# Patient Record
Sex: Male | Born: 1965 | Race: Black or African American | Hispanic: No | Marital: Married | Smoking: Never smoker
Health system: Southern US, Community
[De-identification: ages and names within clinical notes are randomized; demographics above are authoritative.]

## PROBLEM LIST (undated history)

## (undated) DIAGNOSIS — J45909 Unspecified asthma, uncomplicated: Secondary | ICD-10-CM

## (undated) DIAGNOSIS — K219 Gastro-esophageal reflux disease without esophagitis: Secondary | ICD-10-CM

## (undated) DIAGNOSIS — E291 Testicular hypofunction: Secondary | ICD-10-CM

## (undated) DIAGNOSIS — S62101A Fracture of unspecified carpal bone, right wrist, initial encounter for closed fracture: Secondary | ICD-10-CM

## (undated) DIAGNOSIS — I10 Essential (primary) hypertension: Secondary | ICD-10-CM

## (undated) HISTORY — DX: Testicular hypofunction: E29.1

## (undated) HISTORY — DX: Gastro-esophageal reflux disease without esophagitis: K21.9

## (undated) HISTORY — DX: Unspecified asthma, uncomplicated: J45.909

---

## 2000-03-07 ENCOUNTER — Other Ambulatory Visit: Payer: Self-pay | Admitting: Family Medicine

## 2002-05-17 ENCOUNTER — Ambulatory Visit

## 2002-05-18 ENCOUNTER — Encounter: Admitting: Family Medicine

## 2002-05-24 ENCOUNTER — Other Ambulatory Visit: Payer: Self-pay | Admitting: Family Medicine

## 2002-05-26 ENCOUNTER — Encounter

## 2002-06-23 ENCOUNTER — Encounter: Admitting: Family Medicine

## 2002-06-23 ENCOUNTER — Ambulatory Visit

## 2002-06-25 ENCOUNTER — Other Ambulatory Visit: Payer: Self-pay | Admitting: Family Medicine

## 2003-03-22 ENCOUNTER — Ambulatory Visit

## 2003-03-23 ENCOUNTER — Ambulatory Visit: Admitting: Family Medicine

## 2003-03-23 DIAGNOSIS — E291 Testicular hypofunction: Secondary | ICD-10-CM | POA: Insufficient documentation

## 2003-03-23 DIAGNOSIS — K219 Gastro-esophageal reflux disease without esophagitis: Secondary | ICD-10-CM | POA: Insufficient documentation

## 2003-03-23 HISTORY — DX: Gastro-esophageal reflux disease without esophagitis: K21.9

## 2003-03-23 HISTORY — DX: Testicular hypofunction: E29.1

## 2003-03-26 ENCOUNTER — Encounter: Payer: Self-pay | Admitting: Family Medicine

## 2003-03-26 DIAGNOSIS — J45909 Unspecified asthma, uncomplicated: Secondary | ICD-10-CM | POA: Insufficient documentation

## 2003-03-26 HISTORY — DX: Unspecified asthma, uncomplicated: J45.909

## 2003-04-22 ENCOUNTER — Other Ambulatory Visit: Payer: Self-pay | Admitting: Family Medicine

## 2003-04-22 ENCOUNTER — Ambulatory Visit

## 2003-04-29 ENCOUNTER — Emergency Department (HOSPITAL_COMMUNITY): Admission: AD | Admit: 2003-04-29 | Discharge: 2003-04-29 | Payer: Self-pay | Admitting: Family Medicine

## 2003-08-23 ENCOUNTER — Ambulatory Visit

## 2003-10-31 ENCOUNTER — Other Ambulatory Visit: Payer: Self-pay | Admitting: Family Medicine

## 2003-10-31 MED ORDER — RHINOCORT 32 MCG/ACTUATION NASAL SPRAY
INHALATION_SPRAY | NASAL | Status: DC
Start: 2003-10-31 — End: 2003-10-31

## 2003-10-31 MED ORDER — RHINOCORT 32 MCG/ACTUATION NASAL SPRAY
INHALATION_SPRAY | NASAL | Status: DC
Start: 2003-10-31 — End: 2004-09-17

## 2003-10-31 NOTE — Telephone Encounter (Signed)
>>   Alex Werner Mon Oct 31, 2003 3:07 PM  Encounter initiated.

## 2003-10-31 NOTE — Telephone Encounter (Signed)
>>   Alex Werner [facility] Mon Oct 31, 2003 10:57 AM  fax refill request

## 2003-11-07 ENCOUNTER — Other Ambulatory Visit: Payer: Self-pay | Admitting: Family Medicine

## 2003-11-07 MED ORDER — ALBUTEROL 90 MCG/ACTUATION AEROSOL INHALER
INHALATION_SPRAY | RESPIRATORY_TRACT | Status: DC
Start: 2003-11-07 — End: 2004-10-29

## 2003-11-07 MED ORDER — FLOVENT 110 MCG/ACTUATION AEROSOL INHALER
INHALATION_SPRAY | RESPIRATORY_TRACT | Status: DC
Start: 2003-11-07 — End: 2005-04-29

## 2003-11-07 NOTE — Telephone Encounter (Signed)
>>   Alex Werner [facility] Mon Nov 07, 2003 10:59 AM  fax refill request

## 2003-11-21 ENCOUNTER — Ambulatory Visit

## 2003-11-25 ENCOUNTER — Encounter: Admitting: Urology

## 2004-01-18 ENCOUNTER — Ambulatory Visit

## 2004-01-27 ENCOUNTER — Encounter: Admitting: Urology

## 2004-02-06 ENCOUNTER — Other Ambulatory Visit: Payer: Self-pay | Admitting: Family Medicine

## 2004-02-06 MED ORDER — ANDRODERM 5 MG/24 HR TRANSDERMAL 24 HOUR PATCH
MEDICATED_PATCH | TRANSDERMAL | Status: DC
Start: 2004-02-06 — End: 2004-09-10

## 2004-02-06 NOTE — Telephone Encounter (Signed)
>>   [facility] WH[facility] Mon Feb 06, 2004 11:12 AM  Encounter initiated.

## 2004-02-06 NOTE — Telephone Encounter (Signed)
>>   [facility] WHITE Mon Feb 06, 2004 11:55 AM  Encounter initiated.

## 2004-03-26 ENCOUNTER — Ambulatory Visit

## 2004-05-13 ENCOUNTER — Other Ambulatory Visit: Payer: Self-pay | Admitting: Family Medicine

## 2004-05-13 MED ORDER — RANITIDINE 150 MG TABLET
ORAL_TABLET | ORAL | Status: DC
Start: 2004-05-13 — End: 2005-07-24

## 2004-05-13 NOTE — Telephone Encounter (Signed)
>>   JENNIFER ALDRIDGE-GRANT Sun May 13, 2004 12:39 PM  Encounter initiated.

## 2004-07-20 ENCOUNTER — Other Ambulatory Visit: Payer: Self-pay | Admitting: Family Medicine

## 2004-07-20 ENCOUNTER — Encounter: Payer: Self-pay | Admitting: Family Medicine

## 2004-07-20 NOTE — Telephone Encounter (Signed)
>>   Alex Werner MOFFIT Mon Sep 17, 2004 1:57 PM  Encounter initiated.

## 2004-07-24 ENCOUNTER — Ambulatory Visit

## 2004-07-24 ENCOUNTER — Encounter: Payer: Self-pay | Admitting: Family Medicine

## 2004-07-25 ENCOUNTER — Encounter: Admitting: Family Medicine

## 2004-07-26 ENCOUNTER — Ambulatory Visit: Admitting: Family Medicine

## 2004-07-26 VITALS — BP 132/74 | HR 68 | Temp 98.4°F | Resp 12 | Ht 70.0 in | Wt 201.0 lb

## 2004-07-26 NOTE — Nursing Note (Signed)
>>   MCLANE, THERESE M     07/26/2004   9:12 am  Vital signs taken, allergies verified, screened for pain, med hx taken (if appropriate),  Bertell Maria, MA

## 2004-07-26 NOTE — Progress Notes (Signed)
Chief Complaint : Patient presents with:    Refill Request   Toe Problem    S: Alex Werner is a 47yr male .   needs prior auth of androderm doing well , no side effects , has not had lab recently , HAS RUN out of androderm 1 wk ago and can't get it refilled because of auth issues   ros: also has has one month history of toenail fungus on left foot 4th and 5th toenails , not painful     Social History Narrative   pmh - asthma , rectal bleeding, infertility ( couple ) ,rt shoulder pain , low testosterone ,gerd    surgery - none   meds- seravent, flovent 110, albuterol inhaler prn , androderm 5mg  daily,zantac 150mg  bid    allergies none    habits- non smoking , occ etoh , not exercising    sh- married 2 childern ,    fam hx - father with asthma , mom has irregular heart beats,        O:   BP 132/74   Pulse 68   Temp (Src) 98.4 (Tympanic)   Resp 12   Ht 5\' 10"  (1.9m)   Wt 201 lbs (91.2kg)  GENERAL APPEARANCE: appearing stated age in no acute distress   NECK: Neck supple, non-tender without lymphadenopathy, masses or thyromegaly.   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  education given as to diagnosis and treatment, no barriers to  learning ,understanding demonstrated  signed  Ferdinand Lango. MD

## 2004-09-10 ENCOUNTER — Other Ambulatory Visit: Payer: Self-pay | Admitting: Family Medicine

## 2004-09-10 MED ORDER — ANDRODERM 5 MG/24 HR TRANSDERMAL 24 HOUR PATCH
MEDICATED_PATCH | TRANSDERMAL | Status: DC
Start: 2004-09-10 — End: 2005-04-29

## 2004-09-10 NOTE — Telephone Encounter (Signed)
>>   Alex Werner Mon Sep 10, 2004 10:48 AM  Encounter initiated.

## 2004-09-17 ENCOUNTER — Other Ambulatory Visit: Payer: Self-pay | Admitting: Family Medicine

## 2004-09-17 MED ORDER — RHINOCORT 32 MCG/ACTUATION NASAL SPRAY
INHALATION_SPRAY | NASAL | Status: DC
Start: 2004-09-17 — End: 2005-11-22

## 2004-09-17 MED ORDER — RHINOCORT 32 MCG/ACTUATION NASAL SPRAY
INHALATION_SPRAY | NASAL | Status: DC
Start: 2004-09-17 — End: 2007-11-11

## 2004-09-17 NOTE — Telephone Encounter (Signed)
>>   Alex Werner MOFFIT Mon Sep 17, 2004 2:15 PM  Encounter initiated.

## 2004-10-24 ENCOUNTER — Encounter: Payer: Self-pay | Admitting: Family Medicine

## 2004-10-25 ENCOUNTER — Encounter: Payer: Self-pay | Admitting: Family Medicine

## 2004-10-29 ENCOUNTER — Other Ambulatory Visit: Payer: Self-pay | Admitting: Family Medicine

## 2004-10-29 MED ORDER — ALBUTEROL 90 MCG/ACTUATION AEROSOL INHALER
INHALATION_SPRAY | RESPIRATORY_TRACT | Status: DC
Start: 2004-10-29 — End: 2006-03-05

## 2004-10-29 NOTE — Telephone Encounter (Signed)
>>   Alex Werner Mon Oct 29, 2004 12:27 PM  Encounter initiated.

## 2005-04-29 ENCOUNTER — Other Ambulatory Visit: Payer: Self-pay | Admitting: Family Medicine

## 2005-04-29 MED ORDER — FLOVENT 110 MCG/ACTUATION AEROSOL INHALER
INHALATION_SPRAY | RESPIRATORY_TRACT | Status: DC
Start: 2005-04-29 — End: 2006-06-02

## 2005-04-29 MED ORDER — ANDRODERM 5 MG/24 HR TRANSDERMAL 24 HOUR PATCH
MEDICATED_PATCH | TRANSDERMAL | Status: DC
Start: 2005-04-29 — End: 2005-11-22

## 2005-07-24 ENCOUNTER — Other Ambulatory Visit: Payer: Self-pay | Admitting: Family Medicine

## 2005-07-24 MED ORDER — RANITIDINE 150 MG TABLET
ORAL_TABLET | ORAL | Status: DC
Start: 2005-07-24 — End: 2006-09-16

## 2005-08-22 ENCOUNTER — Ambulatory Visit

## 2005-08-23 ENCOUNTER — Encounter: Payer: Self-pay | Admitting: Family Medicine

## 2005-08-23 ENCOUNTER — Ambulatory Visit: Admitting: Family Medicine

## 2005-08-23 VITALS — BP 114/66 | HR 68 | Temp 97.6°F | Resp 16 | Ht 69.0 in | Wt 195.0 lb

## 2005-08-23 MED ORDER — DOXYCYCLINE MONOHYDRATE 100 MG CAPSULE
ORAL_CAPSULE | ORAL | Status: AC
Start: 2005-08-23 — End: 2006-08-23

## 2005-08-23 NOTE — Progress Notes (Signed)
Chief Complaint : Patient presents with:    Skin Problem - spot on fore head   Referral    S: Alex Werner is a 23yr male .   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 ,) coming back   he needs a referral back to urology for vasectomy , had consult but never followed through with procedure and told he needs a new referral   due for lab , is androgen replacement     Social History Narrative   pmh - asthma , rectal bleeding, infertility ( couple ) ,rt shoulder pain , low testosterone ,gerd    surgery - none   meds- flovent 110, albuterol inhaler prn , androderm 5mg  daily,zantac 150mg  bid    allergies none    habits- non smoking , occ etoh , not exercising    sh- married 2 childern ,    fam hx - father with asthma , mom has irregular heart beats,        O:   BP 114/66  Pulse 68  Temp (Src) 97.6 (Tympanic)  Resp 16  Ht 5\' 9"  (1.28m)  Wt 195 lbs (88.5kg)  GENERAL APPEARANCE: appearing stated age in no acute distress   erythematous papules cheeks and foreheads   left forearm there is a small soft tissue mass over area of previous surgery   A:  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 (primary encounter diagnosis)  Note:   Plan: DOXYCYCLINE 100 MG CAP   rtc 2 months     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  education given as to diagnosis and treatment, no barriers to learning ,understanding demonstrated  signed  Ferdinand Lango. MD

## 2005-08-23 NOTE — Nursing Note (Signed)
>>   Alex Werner     08/23/2005   10:30 am  Vital signs taken, allergies verified and screened for pain.  Alex Mins, MA

## 2005-08-29 ENCOUNTER — Encounter

## 2005-09-03 ENCOUNTER — Encounter: Payer: Self-pay | Admitting: Family Medicine

## 2005-09-24 ENCOUNTER — Ambulatory Visit

## 2005-09-24 VITALS — BP 120/84 | Temp 97.1°F | Ht 69.0 in | Wt 197.2 lb

## 2005-09-24 NOTE — Nursing Note (Signed)
>>   Lynelle Smoke [facility]     09/24/2005   8:48 am  Vital signs taken, allergies verified, screened for pain, med hx taken,    Mahmoda Aminy-Ahmadyar, LVN

## 2005-09-24 NOTE — Progress Notes (Signed)
Vasectomy Consultation Note      Date: 09/24/2005  Alex Werner is a 40yr old male who desires elective vasectomy as a permanent means of contraception. ros: ENT: He has history of allergic rhinitis, no epistaxis. No non-healing ulcerations. No hoarseness. No recurrent OM or Sore Throat. No recurrent sinusitis. No tinnitus. No vertigo.  General: No malaise, no weight loss, no appetite disturbance. No fevers or chills.  GI: history of GERD No current pain. No melena. No hematochezia. No constipation or diarrhea. No change in stool caliber. No jaundice. No H/O of hepatitis. No H/O of PUD. No dysphagia. No odynophagia.  Cardiac: No chest pain. No DOE. No orthopnea. No PND. No edema. No weight gain. No palpitations. No lightheadedness or syncope.  NEURO: No loss of vision, diplopia. No recurrent or severe headaches. No transient weakness or numbness. No visual change or speech change. No clumsiness no ataxia. No tremor. No problems with disorientation or confusion.  PSYCHE: No sadness, sleep disturbance, appetite disturbance, phobias. No euphoric episodes.   Resp: history of mild persistent asthma No presistent cough, no wheezing. No hemoptysis. No DOE or orthopneaRHEUM: No joint effusions or erythema. No jaw claudication. No diplopia. No temporal headaches. No h/o serositis or iritis. No alopecia or skin rash.   URO: No dysuria, urinary frequency. No hematuria. No flank pain. 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   EG normal testies Nl SSC no nodule nontender cord and vas normal no hernias   The anatomy, physiology and how a vasectomy works and it is performed was discussed with the patient. It was emphasized that this is an IRREVERSIBLE procedure and that even though there are procedures which can reconnect the ends of the vas deferens, and sperm banking, these methods are not always successful and that there are no guarantees that fertility would be restored. Complications of  bleeding, infection, scrotal hematoma, sperm granuloma and post vas pain syndrome were discussed along with their respective treatments. The chances of spontaneous reversal and unwanted fertility were discussed, being between 1 in 200 and one in 2000. The need to have protected intercourse until two negative semen samples brought to the office one week apart 8 to 12 weeks after the procedure was discussed. The average convalescent time was discussed, as well as the need to limit activity for 2 weeks post-op . The patient verbally acknowledged that he understood and agreed and the counseling form was signed which indicates he understood the irreversible nature of a vasectomy. He will follow up after 72 hours for scheduling of procedure. Uncertain increased risk prostate cancer discussed.  I did review patient's past medical and family/social history.  Barriers to Learning assessed: none. Patient verbalizes understanding of teaching and instructions.   Ivan Anchors Demecia Northway MD

## 2005-10-16 ENCOUNTER — Ambulatory Visit

## 2005-10-25 ENCOUNTER — Encounter

## 2005-11-22 ENCOUNTER — Other Ambulatory Visit: Payer: Self-pay | Admitting: Family Medicine

## 2005-11-22 MED ORDER — RHINOCORT 32 MCG/ACTUATION NASAL SPRAY
INHALATION_SPRAY | NASAL | Status: DC
Start: 2005-11-22 — End: 2007-01-25

## 2005-11-22 MED ORDER — ANDRODERM 5 MG/24 HR TRANSDERMAL 24 HOUR PATCH
MEDICATED_PATCH | TRANSDERMAL | Status: DC
Start: 2005-11-22 — End: 2006-06-02

## 2005-12-06 ENCOUNTER — Ambulatory Visit

## 2005-12-06 VITALS — BP 140/94 | Temp 97.6°F | Ht 69.0 in | Wt 201.4 lb

## 2005-12-06 HISTORY — PX: PR VASECTOMY UNI/BI SPX W/POSTOP SEMEN EXAMS: 55250

## 2005-12-06 MED ORDER — VICODIN 5 MG-500 MG TABLET
ORAL_TABLET | ORAL | Status: DC
Start: 2005-12-06 — End: 2007-11-11

## 2005-12-06 NOTE — Progress Notes (Signed)
Elective Vasectomy   Alex Werner is a 40yr old male  After counseling and informed consent, as evidenced by the patient's signature on consent form, the patient was placed in the supine position and the hair was shaved form the scrotum and Betadine applied to operative area. The R hemiscrotum was then examined and the vas deferens was manually isolated and brought near the surface of the scrotal skin well to the R of midline. Approximately 5cc of 1% plain Xylocaine without epinephrine was injected over this area. A one inch incision was then made over the vas deferens through the scrotal skin. The R vas deferens was then secured with a baby babcock clamp and the surrounding connective tissue and vessels bluntly and sharply dissected. A one inch section of the vas deferens was then ligated at opposite end with a suture ligature and then divided at each end. The ends  were then electrocauterizied from the lumen out. The proximal end was then allowed to retract and tissue was sewn in between both ends with the testicular end secured in the subcutaneous tissue. The wound was then closed. All this was accomplished with 4/0 vicryl. In a like manner the opposite side was done without complications.   The wounds were then dressed with neosporin and 4X4's and the patient was assisted with putting on a tight fitting pair of underware. The patient was instructed to keep this     dressing in place for 48 hours and keep the wounds clean and dry for 72 hours. He was told to do no strenuous activity for two weeks. It was also reiterated that the patient should have no unprotected intercourse until two negative semen samples are obtained 8-12 weeks from this date. The patient was given a prescription for Vicodin 1-2  every 4 to 6 hours for pain and warned that these will cause drowsiness and may be habit forming. (#30 No Refills). He was told to follow up in two weeks or sooner if  copious bleeding occurs or the patient note  any redness, swelling or purulence of the wounds.  Ivan Anchors Lashonne Shull MD  Associate Medical Director   [facility]  [address]  [place], [place] [address]  [phone]            Ivan Anchors. Geroldine Esquivias MD

## 2005-12-06 NOTE — Nursing Note (Signed)
>>   Lynelle Smoke [facility]     12/06/2005   9:36 am  Vital signs taken, allergies verified, screened for pain, med hx taken,    Mahmoda Aminy-Ahmadyar, LVN

## 2005-12-20 ENCOUNTER — Ambulatory Visit

## 2005-12-24 ENCOUNTER — Encounter

## 2005-12-26 ENCOUNTER — Ambulatory Visit

## 2005-12-26 VITALS — BP 136/82 | Temp 97.7°F | Ht 69.0 in | Wt 200.8 lb

## 2005-12-26 NOTE — Progress Notes (Signed)
Alex Werner is a 40yr old male  S: Follow up vasectomy 2 weeks ago. No pain or swelling    O: Wounds are clean and dry without erythema. No scrotal masses    A: Healing vasectomy    P: Patient given sample cup for semen check 4-6 weeks from now. Make appt and donate sample just prior to coming into office, do not let set around. Imperative to use alternate means of contraception in the interim until sample without sperm noted. Patient understands and agrees. Follow up pain or swelling or scrotal mass. Activity as tolerated.    Earmon Phoenix MD

## 2005-12-26 NOTE — Nursing Note (Signed)
>>   Lynelle Smoke [facility]     12/26/2005   10:14 am  Vital signs taken, allergies verified, screened for pain, med hx taken,    Mahmoda Aminy-Ahmadyar, LVN

## 2006-01-28 ENCOUNTER — Ambulatory Visit

## 2006-01-28 VITALS — BP 128/70 | Temp 97.3°F

## 2006-01-28 NOTE — Nursing Note (Signed)
>>   BORJAS, MONICA L     01/28/2006   8:33 am  Vital signs taken, allergies verified, screened for pain, med hx taken (if appropriate).  Harlen Labs, MA

## 2006-01-28 NOTE — Progress Notes (Signed)
On semen examination a very occasional nonmotile sperm noted. Continue alternate means of contraception and repeat sample 2 weeks. Patient verbalizes understanding of instructions

## 2006-02-25 ENCOUNTER — Encounter

## 2006-02-25 ENCOUNTER — Encounter: Payer: Self-pay | Admitting: Family Medicine

## 2006-03-05 ENCOUNTER — Other Ambulatory Visit: Payer: Self-pay | Admitting: Family Medicine

## 2006-03-05 MED ORDER — ALBUTEROL 90 MCG/ACTUATION AEROSOL INHALER
INHALATION_SPRAY | RESPIRATORY_TRACT | Status: DC
Start: 2006-03-05 — End: 2007-04-13

## 2006-04-09 ENCOUNTER — Ambulatory Visit

## 2006-05-01 ENCOUNTER — Ambulatory Visit

## 2006-05-01 VITALS — BP 120/72 | HR 60 | Temp 98.0°F | Ht 69.0 in | Wt 204.2 lb

## 2006-05-01 NOTE — Nursing Note (Signed)
>>   HOUY NGOY     Thu May 01, 2006  8:48 AM  Vitals signs taken, allergies verified,screened for pain, med hx taken. Adelene Idler, [place]

## 2006-05-01 NOTE — Progress Notes (Signed)
Careful examination of fresh semen sample revealed NO sperm. OK to rely on vasectomy alone. Follow up as needed

## 2006-06-02 ENCOUNTER — Other Ambulatory Visit: Payer: Self-pay | Admitting: Family Medicine

## 2006-06-02 MED ORDER — FLOVENT 110 MCG/ACTUATION AEROSOL INHALER
INHALATION_SPRAY | RESPIRATORY_TRACT | Status: DC
Start: 2006-06-02 — End: 2007-11-08

## 2006-06-02 MED ORDER — ANDRODERM 5 MG/24 HR TRANSDERMAL 24 HOUR PATCH
MEDICATED_PATCH | TRANSDERMAL | Status: DC
Start: 2006-06-02 — End: 2007-01-26

## 2006-09-16 ENCOUNTER — Other Ambulatory Visit: Payer: Self-pay | Admitting: Family Medicine

## 2006-09-16 MED ORDER — RANITIDINE 150 MG TABLET
ORAL_TABLET | ORAL | Status: DC
Start: 2006-09-16 — End: 2007-02-23

## 2006-11-05 ENCOUNTER — Other Ambulatory Visit: Payer: Self-pay | Admitting: Family Medicine

## 2006-11-05 MED ORDER — DOXYCYCLINE HYCLATE 100 MG CAPSULE
ORAL_CAPSULE | ORAL | Status: AC
Start: 2006-11-05 — End: 2006-11-15

## 2007-01-25 ENCOUNTER — Other Ambulatory Visit: Payer: Self-pay | Admitting: Family Medicine

## 2007-01-25 MED ORDER — RHINOCORT 32 MCG/ACTUATION NASAL SPRAY
INHALATION_SPRAY | NASAL | Status: DC
Start: 2007-01-25 — End: 2007-11-08

## 2007-01-26 ENCOUNTER — Other Ambulatory Visit: Payer: Self-pay | Admitting: FAMILY PRACTICE

## 2007-01-26 MED ORDER — ANDRODERM 5 MG/24 HR TRANSDERMAL 24 HOUR PATCH
MEDICATED_PATCH | TRANSDERMAL | Status: DC
Start: 2007-01-26 — End: 2007-01-29

## 2007-01-29 ENCOUNTER — Other Ambulatory Visit: Payer: Self-pay | Admitting: Family Medicine

## 2007-01-29 MED ORDER — ANDRODERM 5 MG/24 HR TRANSDERMAL 24 HOUR PATCH
MEDICATED_PATCH | TRANSDERMAL | Status: DC
Start: 2007-01-29 — End: 2007-05-11

## 2007-02-23 ENCOUNTER — Encounter

## 2007-02-23 ENCOUNTER — Encounter: Payer: Self-pay | Admitting: Family Medicine

## 2007-02-23 ENCOUNTER — Ambulatory Visit: Admitting: Family Medicine

## 2007-02-23 ENCOUNTER — Ambulatory Visit

## 2007-02-23 VITALS — BP 136/84 | HR 72 | Temp 96.7°F | Resp 16 | Ht 69.0 in | Wt 216.4 lb

## 2007-02-23 MED ORDER — ROBAXIN-750  750 MG TABLET
ORAL_TABLET | ORAL | Status: DC
Start: 2007-02-23 — End: 2007-06-05

## 2007-02-23 MED ORDER — PENICILLIN V POTASSIUM 500 MG TABLET
ORAL_TABLET | ORAL | Status: DC
Start: 2007-02-23 — End: 2007-11-11

## 2007-02-23 MED ORDER — IBUPROFEN 600 MG TABLET
ORAL_TABLET | ORAL | Status: DC
Start: 2007-02-23 — End: 2007-05-06

## 2007-02-23 NOTE — Nursing Note (Signed)
>>   [facility] MOUA     Mon Feb 23, 2007  9:22 AM  Vital signs taken, allergies verified and screened for pain.  Aneta Mins, MA

## 2007-02-23 NOTE — Progress Notes (Signed)
Chief Complaint :  Chief Complaint   Patient presents with    Back Pain    Other     THROAT SWOLLENING    Snoring         S:  Alex Werner is a 22yr male .   snoring , chronic ,  lifelong , but worse recently ,   has gained weight over the years,   no sleep apnea,    some increased day time drowsiness, ,    chronic  low back pain   , 3 wks , , lower  back ,n o pain down legs,  no injury to back , no repetitive injury ,   sore throat    for 1 day  , swollen  ulvula ,  no  swollen glands in neck ,no fever,   no cough     due for follow up  lab is on androgen therapy   History   Social History Narrative      pmh - asthma , rectal bleeding, infertility ( couple ) ,rt shoulder pain , low testosterone ,gerd   rosacea     surgery - vasectomy     meds-  flovent 110,  albuterol inhaler prn ,  androderm 5mg  daily    allergies none     habits- non smoking , occ etoh , not exercising     sh- married  2 childern ,     fam hx - father with asthma , mom has irregular heart beats,       O:   BP 136/84  Pulse 72  Temp (Src) 35.9 C (96.7 F) (Tympanic)  Resp 16  Ht 1.753 m (5\' 9" )  Wt 98.175 kg (216 lb 7 oz)  GENERAL APPEARANCE: appearing stated age in no acute distress   erythema throat redness, swollen uvula    tympanic membranes normal   NECK: Neck supple, non-tender without lymphadenopathy, masses or thyromegaly.   CHEST: clear to auscultation  , heart nsr no murmur   tender in low back , limitation of flexion to   30 degrees   straight leg raising negative ,Reflexes equal and symmetric  , strength and sensatiion intact      A:  786.09AF Snoring  (primary encounter diagnosis)  Comment:   Plan: SLEEP STUDY        discussed possible approaches to snoring including  mouth appliances and use of  nasal cpap , surgery ,      462AH Pharyngitis Acute  Comment:   Plan: CULTURE BETA STREP ONLY, PENICILLIN V POTASSIUM        500 MG TAB        gargle with warm saline   chloroseptic spray     724.2 Lumbago  Comment:   Plan:  IBUPROFEN 600 MG TAB, ROBAXIN-750 750 MG TAB        Discussed a home back care exercise program with flexion exercise routine and a back exercise handout was given . Proper lifting with avoidance of heavy lifting discussed.     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   education given as to diagnosis and treatment, no barriers to learning ,understanding demonstrated  signed  Ferdinand Lango. MD

## 2007-02-24 ENCOUNTER — Encounter: Payer: Self-pay | Admitting: Family Medicine

## 2007-02-25 ENCOUNTER — Encounter: Payer: Self-pay | Admitting: Family Medicine

## 2007-02-26 NOTE — Progress Notes (Addendum)
tests were all normal  except for   the testosterone    has a normal     total testosterone now and a borderline low  free testosterone ,     <br    />recommend you just continue your current dose <br />throat culture    was negative for strep

## 2007-03-31 ENCOUNTER — Encounter: Payer: Self-pay | Admitting: Family Medicine

## 2007-04-01 NOTE — Progress Notes (Addendum)
here is   a new   supplier   of anti anti snoring mouth device<br    />[email or web address]

## 2007-04-13 ENCOUNTER — Other Ambulatory Visit: Payer: Self-pay | Admitting: Family Medicine

## 2007-04-13 MED ORDER — ALBUTEROL 90 MCG/ACTUATION AEROSOL INHALER
INHALATION_SPRAY | RESPIRATORY_TRACT | Status: DC
Start: 2007-04-13 — End: 2009-03-08

## 2007-04-14 ENCOUNTER — Encounter: Payer: Self-pay | Admitting: Family Medicine

## 2007-04-24 ENCOUNTER — Encounter: Payer: Self-pay | Admitting: Family Medicine

## 2007-04-24 NOTE — Progress Notes (Addendum)
the first test confirmed that you had  signficant sleep apnea   with    decreased breathing and drop in oxygen .. the next step is a trial of    the treatment for this which involves  sleeping with a device that    pushes pressurized air through your nose , and finding out  what    pressure is the best pressure , if this  test confirms that  the    treatment  is useful then the machine could be ordered for you .Marland Kitchen.   if    you wish to schedule the test  just call the sleep lab , if you want to    discuss this further make an appt with me

## 2007-05-06 ENCOUNTER — Other Ambulatory Visit: Payer: Self-pay | Admitting: Family Medicine

## 2007-05-06 MED ORDER — IBUPROFEN 600 MG TABLET
ORAL_TABLET | ORAL | Status: AC
Start: 2007-05-06 — End: 2008-05-05

## 2007-05-11 ENCOUNTER — Other Ambulatory Visit: Payer: Self-pay | Admitting: Family Medicine

## 2007-05-11 MED ORDER — ANDRODERM 5 MG/24 HR TRANSDERMAL 24 HOUR PATCH
MEDICATED_PATCH | TRANSDERMAL | Status: DC
Start: 2007-05-11 — End: 2007-12-31

## 2007-06-05 ENCOUNTER — Other Ambulatory Visit: Payer: Self-pay | Admitting: Family Medicine

## 2007-06-05 MED ORDER — ROBAXIN-750  750 MG TABLET
ORAL_TABLET | ORAL | Status: DC
Start: 2007-06-05 — End: 2009-03-08

## 2007-11-08 ENCOUNTER — Other Ambulatory Visit: Payer: Self-pay | Admitting: Family Medicine

## 2007-11-08 MED ORDER — DOXYCYCLINE MONOHYDRATE 100 MG CAPSULE
1.0000 | ORAL_CAPSULE | Freq: Two times a day (BID) | ORAL | Status: DC
Start: 2007-11-08 — End: 2009-03-08

## 2007-11-08 MED ORDER — FLUTICASONE 110 MCG/ACTUATION AEROSOL ORAL INHALER
1.0000 | INHALATION_SPRAY | Freq: Two times a day (BID) | RESPIRATORY_TRACT | Status: DC
Start: 2007-11-08 — End: 2009-03-08

## 2007-11-08 MED ORDER — BUDESONIDE 32 MCG/ACTUATION NASAL SPRAY,AEROSOL
2.0000 | INHALATION_SPRAY | Freq: Two times a day (BID) | NASAL | Status: DC
Start: 2007-11-08 — End: 2007-11-11

## 2007-11-10 ENCOUNTER — Ambulatory Visit

## 2007-11-11 ENCOUNTER — Encounter: Payer: Self-pay | Admitting: Family Medicine

## 2007-11-11 ENCOUNTER — Encounter

## 2007-11-11 ENCOUNTER — Ambulatory Visit: Admitting: Family Medicine

## 2007-11-11 VITALS — BP 130/76 | HR 60 | Temp 97.4°F | Resp 12 | Ht 69.0 in | Wt 214.1 lb

## 2007-11-11 DIAGNOSIS — M545 Low back pain, unspecified: Secondary | ICD-10-CM | POA: Insufficient documentation

## 2007-11-11 MED ORDER — CITALOPRAM 20 MG TABLET
20.0000 mg | ORAL_TABLET | Freq: Every day | ORAL | Status: DC
Start: 2007-11-11 — End: 2008-12-08

## 2007-11-11 NOTE — Progress Notes (Signed)
Chief Complaint :  Chief Complaint   Patient presents with    Medication Follow Up    Anxiety     Worse then before:  no medications given for this problem.  Daily anxiety,  mostly work related.       S:  Alex Werner is a 84yr male .   under stress , stressful at work , irritable , some depression as well  , ,   not much etoh  , does exercise, ,   RUE:[id number] tingling in bottom of feet   and fingers,   eating a healthy diet and  taking multivitamins ,   chronic low back pain , no pain down legs, needing to take  motrin and robaxin regularly   is on androderm  due for shot   has rosacea controlled on doxcycyline   asthma and allergies under control at present     History   Social History Narrative     pmh - asthma , rectal bleeding, infertility ( couple ) ,rt shoulder pain , low testosterone ,gerd   rosacea , lumbago     surgery - vasectomy     meds-  Current outpatient prescriptions   Medication Sig Dispense Refill    Doxycycline (MONODOX) 100 mg PO capsule Take 1 Cap by mouth 2 times daily.   60  12    Fluticasone (FLUTICASONE) 110 mcg/Actuation INHA Inhaler Take 1 Puff by inhalation 2 times daily.   12  12    ROBAXIN-750 750 MG TAB take 1 tablet (750 mg) by oral route every 4-6 hours  60   12    ANDRODERM 5 MG/24 HR TRANSDERM 24 HR PATCH apply 1 patch (5mg ) by transdermal route once daily  30  6    IBUPROFEN 600 MG TAB take 1 tablet by mouth 3 times daily as needed for  back pain  90  12    ALBUTEROL 90 MCG/ACTUATION AEROSOL INHALER inhale 1 puff by inhalation route every 4-6 hours as needed  1  12    SEREVENT DISKUS 50 MCG/DOSE INHL DSDV inhale the contents of one capsule by inhalation route 2 times per day morning and evening    0          allergies none     habits- non smoking , occ etoh , not exercising     sh- married  2 childern ,     fam hx - father with asthma , mom has irregular heart beats,       O:   BP 130/76  Pulse 60  Temp (Src) 36.3 C (97.4 F) (Tympanic)  Resp 12  Ht 1.753  m (5\' 9" )  Wt 97.115 kg (214 lb 1.6 oz)  GENERAL APPEARANCE: appearing stated age in no acute distress   affect normal   CHEST: clear to auscultation  , heart nsr no murmur   Reflexes equal and symmetric  , strength and sensatiion intact      A:  724.2 Lumbago  (primary encounter diagnosis)  Comment:   Plan: L-SPINE 2 OR 3 VIEWS        Discussed a home back care exercise program with flexion exercise routine and a back exercise handout was given . Proper lifting with avoidance of heavy lifting discussed.     356.9CE Peripheral Neuropathy  Comment:   Plan: URINALYSIS-COMPLETE, TSH (SENSITIVE),         THYROXINE, FREE (FREE T4), LIPID PANEL WITH  DLDL REFLEX, COMPREHENSIVE METABOLIC PANEL, CBC        AUTO + REFLEX MANUAL DIFF, VITAMIN B12,         HEMOGLOBIN A1C, FERRITIN, IRON TOTAL,         ANTI-NUCLEAR AB (ANA), FOLATE, TRANSFERRIN,         TESTOSTERONE PANEL, SED RATE WESTERGREN,         SYPHILIS TEST SERUM (RPR)            300.00G Anxiety Disorder  Comment:   Plan: CITALOPRAM 20 MG TAB        discussed the use of antianxiety medications , the various medications available and potential side effects and the reason for the current selection of medication , supportative counselling given ,        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   education given as to diagnosis and treatment, no barriers to learning ,understanding demonstrated  signed  Ferdinand Lango. MD

## 2007-11-11 NOTE — Nursing Note (Signed)
>>   Alex Werner     Wed Nov 11, 2007  8:54 AM  vitals taken, allergies verified, screened for pain.Dierdre Forth  Thank you

## 2007-11-15 ENCOUNTER — Encounter: Payer: Self-pay | Admitting: Family Medicine

## 2007-12-31 ENCOUNTER — Other Ambulatory Visit: Payer: Self-pay | Admitting: Family Medicine

## 2007-12-31 MED ORDER — TESTOSTERONE 5 MG/24 HR TRANSDERMAL 24 HOUR PATCH
1.0000 | MEDICATED_PATCH | Freq: Every day | TRANSDERMAL | Status: DC
Start: 2007-12-31 — End: 2008-09-05

## 2008-05-16 ENCOUNTER — Other Ambulatory Visit: Payer: Self-pay | Admitting: Family Medicine

## 2008-05-16 MED ORDER — ALBUTEROL SULFATE HFA 90 MCG/ACTUATION AEROSOL INHALER: 1.0000 | INHALATION_SPRAY | RESPIRATORY_TRACT | Status: DC | PRN

## 2008-09-05 ENCOUNTER — Other Ambulatory Visit: Payer: Self-pay | Admitting: Family Medicine

## 2008-09-05 MED ORDER — TESTOSTERONE 5 MG/24 HR TRANSDERMAL 24 HOUR PATCH
1.0000 | MEDICATED_PATCH | Freq: Every day | TRANSDERMAL | Status: DC
Start: 2008-09-05 — End: 2009-03-07

## 2008-12-08 ENCOUNTER — Other Ambulatory Visit: Payer: Self-pay | Admitting: Family Medicine

## 2008-12-08 MED ORDER — CITALOPRAM 20 MG TABLET
20.0000 mg | ORAL_TABLET | Freq: Every day | ORAL | Status: DC
Start: 2008-12-08 — End: 2009-12-28

## 2009-01-21 ENCOUNTER — Other Ambulatory Visit: Payer: Self-pay | Admitting: Family Medicine

## 2009-01-21 MED ORDER — BUDESONIDE 32 MCG/ACTUATION NASAL SPRAY
2.0000 | Freq: Every day | NASAL | Status: DC
Start: 2009-01-21 — End: 2011-09-30

## 2009-03-07 ENCOUNTER — Other Ambulatory Visit: Payer: Self-pay | Admitting: Family Medicine

## 2009-03-07 ENCOUNTER — Ambulatory Visit

## 2009-03-07 MED ORDER — TESTOSTERONE 5 MG/24 HR TRANSDERMAL 24 HOUR PATCH
1.0000 | MEDICATED_PATCH | Freq: Every day | TRANSDERMAL | Status: DC
Start: 2009-03-07 — End: 2009-03-08

## 2009-03-07 NOTE — Telephone Encounter (Signed)
call patient and  tell him that i refilled  his testosterone for only one month with one refill and he needs an appt before further refills

## 2009-03-08 ENCOUNTER — Encounter: Payer: Self-pay | Admitting: Family Medicine

## 2009-03-08 ENCOUNTER — Ambulatory Visit: Payer: Self-pay | Admitting: Family Medicine

## 2009-03-08 VITALS — BP 120/80 | HR 72 | Temp 97.6°F | Wt 221.5 lb

## 2009-03-08 DIAGNOSIS — G4730 Sleep apnea, unspecified: Secondary | ICD-10-CM | POA: Insufficient documentation

## 2009-03-08 DIAGNOSIS — R5383 Other fatigue: Secondary | ICD-10-CM | POA: Insufficient documentation

## 2009-03-08 NOTE — Progress Notes (Signed)
Chief Complaint :  Chief Complaint   Patient presents with    Other     low testosterone       S:  Alex Werner is a 20yr male .   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  8.5 g  12    ALBUTEROL 90 MCG/ACTUATION AEROSOL INHALER inhale 1 puff by inhalation route every 4-6 hours as needed  1  12    Budesonide Nasal (RHINOCORT AQUA) 32 mcg/Actuation Spry Spray Instill 2 Sprays into EACH nostril every day. (allergies)  8.6 g  12    Citalopram (CELEXA) 20 mg PO Tablet Take 1 Tab by mouth every day.  30 Tab  12    Doxycycline (MONODOX) 100 mg PO capsule Take 1 Cap by mouth 2 times daily.   60  12    Fluticasone (FLUTICASONE) 110 mcg/Actuation INHA Inhaler Take 1 Puff by inhalation 2 times daily.   12  12    ROBAXIN-750 750 MG TAB take 1 tablet (750 mg) by oral route every 4-6 hours  60   12    SEREVENT DISKUS 50 MCG/DOSE INHL DSDV inhale the contents of one capsule by inhalation route 2 times per day morning and evening    0    Testosterone (ANDRODERM) 5 mg/24 hr patch Apply 1 Patch to the skin every day.  30 Patch  1       allergies    No Known Allergies    Other-Reaction in Comments    Comment:N/A  Past Surgical History   Procedure Date    Vasectomy, unilat/bilat, w/postop semen exam 12/06/05      Vasectomy       Family History   Problem Relation Age of Onset    Asthma Father     Heart Mother      arrythmia        History   Social History    Marital Status: MARRIED     Spouse Name: N/A     Number of Children: 2    Years of Education: N/A   Social History Main Topics    Tobacco Use: Never    Alcohol Use: 1.0 oz/week    Drug Use: No    Sexually Active: Yes -- Male partner(s)   Other Topics Concern    Not on file   Social History Narrative     pmh - asthma , rectal bleeding, infertility ( couple ) ,rt shoulder pain , low testosterone ,gerd rosacea , lumbago   surgery - vasectomy   meds-  see med list   allergies none  habits- non smoking , occ etoh , not exercising   sh- married  2 childern ,   fam hx - father with asthma , mom has irregular heart beats,         O:   BP 120/80  Pulse 72  Temp(Src) 36.4 C (97.6 F) (Tympanic)  Wt 100.472 kg (221 lb 8 oz)  Filed Vitals:    03/08/2009   8:09 AM   PainSc: No pain     GENERAL APPEARANCE: appearing stated age in no acute distress   GENITALIA  - penis , scrotum ,testes normal , no hernia       rectal :   prostate  normal stool  brown heme negative     A:  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             780.79B Fatigue  Comment:   Plan: TESTOSTERONE injection trial             780.57C Sleep apnea  Comment:   Plan: continue nasal cpap      P:  SEE EPICARE ORDERS  FOR DOCUMENTATION OF DIAGNOSTIC AND THERAPEUTIC ORDERS  FOR THIS VISIT     I did review with the patient or patient's caretaker  the  patient's past medical and family/social history and updated the history  as necessary .   education given as to diagnosis and treatment, no  barriers to learning ,understanding demonstrated  signed  Ferdinand Lango. MD

## 2009-03-08 NOTE — Nursing Note (Signed)
>>   Alex Werner     Wed Mar 08, 2009  8:42 AM  Injection verified by Dr. Caryl Never  Per orders of Dr. Caryl Never, injection of testosterone 100MG  given by Sherrie Mustache, RMA. Patient instructed to remain in clinic for 20 minutes afterwards, and to report any adverse reaction to me immediately.  Sherrie Mustache, RMA    >> Alex Werner     Wed Mar 08, 2009  8:10 AM  Vital signs taken, allergies verified, screened for pain .  Sherrie Mustache, RMA

## 2009-03-08 NOTE — Telephone Encounter (Addendum)
Left msg to call the office

## 2009-03-22 ENCOUNTER — Ambulatory Visit

## 2009-03-22 NOTE — Nursing Note (Signed)
>>   Alex Werner     Wed Mar 22, 2009  9:58 AM  Per orders of Dr. Darol Destine injection of TESTOSTERONE 100MG  WAS given by Nils Pyle. Patient instructed to remain in clinic for 20 minutes afterwards, and to report any adverse reaction to me immediately.  Order and injection verified by Dr. Caryl Never.

## 2009-04-05 ENCOUNTER — Encounter

## 2009-04-25 ENCOUNTER — Ambulatory Visit

## 2009-04-26 ENCOUNTER — Ambulatory Visit

## 2009-04-26 NOTE — Nursing Note (Signed)
>>   Alex Werner     Wed Apr 26, 2009 12:10 PM  Per orders of Dr. Weston Settle injection of TESTOSTERONE 100MG  given by Nils Pyle. Patient instructed to remain in clinic for 20 minutes afterwards, and to report any adverse reaction to me immediately.  Order and injection verified by Dr. Caryl Never.

## 2009-05-10 ENCOUNTER — Other Ambulatory Visit: Payer: Self-pay | Admitting: Family Medicine

## 2009-05-10 ENCOUNTER — Ambulatory Visit

## 2009-05-10 NOTE — Nursing Note (Signed)
>>   Alex Werner     Wed May 10, 2009 12:08 PM  Per orders of Dr. Darol Destine injection of TESTOSTERONE 100MG  WAS given by Nils Pyle. Patient instructed to remain in clinic for 20 minutes afterwards, and to report any adverse reaction to me immediately.  Order and injection verified by Dr. Caryl Never.

## 2009-05-17 ENCOUNTER — Encounter: Payer: Self-pay | Admitting: Family Medicine

## 2009-05-17 ENCOUNTER — Ambulatory Visit

## 2009-05-17 ENCOUNTER — Encounter

## 2009-05-22 ENCOUNTER — Ambulatory Visit: Admitting: Family Medicine

## 2009-05-22 VITALS — BP 120/80 | HR 72 | Temp 97.9°F | Wt 227.2 lb

## 2009-05-22 NOTE — Nursing Note (Signed)
>>   Alex Werner     Mon May 22, 2009  8:51 AM  Immunization VIS documentation(s) were given to [name] to review. All questions were answered and the patient consented to the Immunization(s) being given. Patient allergies were reviewed and no contraindications were found. The immunization(s) were given as ordered. The patient was observed for any immediate reactions to the vaccine. None were observed.  Injection verified by Dr. Galvin Proffer, RMA    >> [facility]     Mon May 22, 2009  8:33 AM  Vital signs taken, allergies verified, screened for pain .  Sherrie Mustache, RMA

## 2009-05-22 NOTE — Progress Notes (Signed)
Chief Complaint :  Chief Complaint   Patient presents with    Physical       S:  Alex Werner is a 32yr male .   has been on testosterone injection   not a major change , but wishes to try it longer.    some gerd uses tums ,   on asthma meds,   ,   using sleep apnea  machine cpap   ros: no chest pain or shortness of breath, no change in bms or urination, no recent wt gain or loss. 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  8.5 g  12    Budesonide Nasal (RHINOCORT AQUA) 32 mcg/Actuation Spry Spray Instill 2 Sprays into EACH nostril every day. (allergies)  8.6 g  12    Citalopram (CELEXA) 20 mg PO Tablet Take 1 Tab by mouth every day.  30 Tab  12    Fluticasone (FLUTICASONE) 110 mcg/Actuation Inhaler Take 1 Puff by inhalation 2 times daily.  12 g  12       allergies    No Known Allergies    Other-Reaction in Comments    Comment:N/A  Past Surgical History   Procedure Date    Vasectomy, unilat/bilat, w/postop semen exam 12/06/05     Vasectomy       Family History   Problem Relation Age of Onset    Asthma Father     Heart Mother      arrythmia        History   Social History    Marital Status: MARRIED     Spouse Name: N/A     Number of Children: 2    Years of Education: N/A   Social History Main Topics    Smoking status: Never Smoker     Smokeless tobacco: Never Used    Alcohol Use: 1.0 oz/week    Drug Use: No    Sexually Active: Yes -- Male partner(s)   Other Topics Concern    Not on file   Social History Narrative     pmh - asthma , rectal bleeding, infertility ( couple ) ,rt  shoulder pain , low testosterone ,gerd rosacea , lumbago   surgery - vasectomy   meds-  see med list   allergies none   habits- non smoking , occ etoh , not exercising   sh- married  2 childern ,   fam hx - father with asthma , mom has irregular heart beats,         O:   BP 120/80  Pulse 72  Temp(Src) 36.6 C (97.9 F) (Tympanic)  Wt 103.057 kg (227 lb 3.2 oz)  Filed Vitals:    05/22/2009   8:32 AM   PainSc: No pain       alert appearing stated age in no acute distress  eyes:sclera and conj normal , pupils equal and reactive., fundi normal,   ears and nose normal to inspection, tms normal , hearing  intact, nasal mucosa normal ,lips teeth and gums unremarkable, oropharnyx  normal   neck no nodes , no thyromegaly   chest:clear to ausc and percussion , respiratory effort normal , palpation of chest normal   heart:nsr no murm  pmi not displaced . carotids 2+ no bruits , femoral , aortic and pedal pulses normal, extremities without edema   ZOX:WRUE no masses or hepatosplenomegaly , bowel sounds normal , no hernia   genitalia : scrotum, testicle , penis normal   rectal normal , stool hematest neg, prostate normal   AVW:UJWJXB , no evidence of deformity ,   back:nontender, straight  neuro:cn 2-12 wnl, reflexes equal and symmetric, strength and sensation intact  lymph: no lymphadenopathy, neck axilla, or inguinal areas   skin:no atypical nevi , skin warm and dry to palpation   testosterone levels normal on shots   remainder of lab noraml   A:  V70.0 Routine general medical examination at a health care facility  (primary encounter diagnosis)  Comment:   Plan: TDAP VACCINE IM  (ADACEL)        discussed testicular self exam , skin self exam , diet and exercise     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   education given as to diagnosis and treatment, no barriers to learning ,understanding demonstrated  signed electronically   Ferdinand Lango. MD  Attending Physician

## 2009-05-24 ENCOUNTER — Ambulatory Visit

## 2009-05-24 NOTE — Nursing Note (Signed)
>>   [name]     Wed May 24, 2009  9:42 AM  Per orders of Dr. Darol Destine injection of TESTOSTERONE 100MG  WAS given by Nils Pyle. Patient instructed to remain in clinic for 20 minutes afterwards, and to report any adverse reaction to me immediately.  Order and injection verified by Dr. Caryl Never.

## 2009-06-07 ENCOUNTER — Ambulatory Visit

## 2009-06-07 NOTE — Nursing Note (Signed)
>>   [name]     Wed Jun 07, 2009  2:21 PM  Per orders of Dr. Darol Destine injection of TESTOSTERONE 100MG  WAS given by Nils Pyle. Patient instructed to remain in clinic for 20 minutes afterwards, and to report any adverse reaction to me immediately.  Order and injection verified by Dr. Caryl Never.

## 2009-06-21 ENCOUNTER — Ambulatory Visit

## 2009-06-21 NOTE — Nursing Note (Signed)
>>   Alex Werner     Wed Jun 21, 2009  2:44 PM  Per orders of Dr. Darol Destine injection of TESTOSTERONE 100MG  WAS given by Nils Pyle. Patient instructed to remain in clinic for 20 minutes afterwards, and to report any adverse reaction to me immediately.  Order and injection verified by Dr. Caryl Never.

## 2009-07-05 ENCOUNTER — Ambulatory Visit

## 2009-07-05 NOTE — Nursing Note (Signed)
>>   Alex Werner     Wed Jul 05, 2009 10:53 AM  Per orders of Dr. Darol Destine injection of TESTOSTERONE 100MG  was given by Nils Pyle. Patient instructed to remain in clinic for 20 minutes afterwards, and to report any adverse reaction to me immediately.  Order and injection verified by Dr. Caryl Never.

## 2009-07-07 ENCOUNTER — Other Ambulatory Visit: Payer: Self-pay | Admitting: Family Medicine

## 2009-07-07 MED ORDER — FLUTICASONE 110 MCG/ACTUATION AEROSOL ORAL INHALER
1.0000 | INHALATION_SPRAY | Freq: Two times a day (BID) | RESPIRATORY_TRACT | Status: DC
Start: 2009-07-07 — End: 2011-11-05

## 2009-07-07 MED ORDER — ALBUTEROL SULFATE HFA 90 MCG/ACTUATION AEROSOL INHALER: 1.0000 | INHALATION_SPRAY | RESPIRATORY_TRACT | Status: DC | PRN

## 2009-07-19 ENCOUNTER — Ambulatory Visit

## 2009-07-19 NOTE — Nursing Note (Signed)
>>   [name]     Wed Jul 19, 2009 10:37 AM  Per orders of Dr. Darol Destine injection of TESTOSTERONE 100MG  WAS given by Nils Pyle. Patient instructed to remain in clinic for 20 minutes afterwards, and to report any adverse reaction to me immediately.  Order and injection verified by Dr. Caryl Never.

## 2009-07-26 ENCOUNTER — Ambulatory Visit: Admitting: Family Medicine

## 2009-07-26 VITALS — BP 120/74 | Temp 98.2°F | Ht 70.0 in | Wt 228.0 lb

## 2009-07-26 MED ORDER — AZITHROMYCIN 250 MG TABLET
ORAL_TABLET | ORAL | Status: AC
Start: 2009-07-26 — End: 2009-07-31

## 2009-07-26 NOTE — Nursing Note (Signed)
>>   Theresa Duty, MA     Wed Jul 26, 2009  2:23 PM  Vital signs taken, allergies verified, screened for pain,   Antony Salmon MA.

## 2009-07-26 NOTE — Progress Notes (Signed)
Chief Complaint :  Chief Complaint   Patient presents with    Sinus Problem    Chest Problem       S:  Alex Werner is a 59yr male .   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  8.5 g  6    Budesonide Nasal (RHINOCORT AQUA) 32 mcg/Actuation Spry Spray Instill 2 Sprays into EACH nostril every day. (allergies)  8.6 g  12    Citalopram (CELEXA) 20 mg PO Tablet Take 1 Tab by mouth every day.  30 Tab  12    Fluticasone (FLOVENT HFA) 110 mcg/Actuation Inhaler Take 1 Puff by inhalation every 12 hours.  12 g  6       allergies    No Known Allergies    Other-Reaction in Comments    Comment:N/A  Past Surgical History   Procedure Date    Vasectomy, unilat/bilat, w/postop semen exam 12/06/05     Vasectomy       Family History   Problem Relation Age of Onset    Asthma Father     Heart Mother      arrythmia        History   Social History    Marital Status: MARRIED     Spouse Name: N/A     Number of Children: 2    Years of Education: N/A   Social History Main Topics    Smoking status: Never Smoker     Smokeless tobacco: Never Used    Alcohol Use: 1.0 oz/week    Drug Use: No    Sexually Active: Yes -- Male partner(s)   Other Topics Concern    Not on file   Social History Narrative     pmh - asthma , rectal bleeding, infertility ( couple ) ,rt shoulder pain , low testosterone ,gerd rosacea , lumbago   surgery - vasectomy   meds-  see med list   allergies none   habits- non smoking , occ etoh , not exercising   sh- married  2  childern ,   fam hx - father with asthma , mom has irregular heart beats,         O:   BP 120/74  Temp(Src) 36.8 C (98.2 F) (Tympanic)  Ht 1.778 m (5\' 10" )  Wt 103.44 kg (228 lb 0.7 oz)  BMI 32.72 kg/m2  Filed Vitals:    07/26/2009   2:21 PM   PainSc: No pain     GENERAL APPEARANCE: appearing stated age in no acute distress   edematous nasal mucosa , , throat mild erythema,  tms normal    CHEST: clear to auscultation  , heart nsr no murmur     A:  461.9J Acute sinusitis  (primary encounter diagnosis)  Comment:   Plan: Azithromycin (ZITHROMAX Z-PAK) 250 mg Tablet  continue inhalers and nasal sprays    robitussin -dm  otc prn cough   .,Call or return to clinic prn if these symptoms worsen or fail to improve as anticipated.   P:  SEE EPICARE ORDERS  FOR DOCUMENTATION OF DIAGNOSTIC AND THERAPEUTIC ORDERS  FOR THIS VISIT     I did review with the patient or patient's caretaker  the  patient's past medical and family/social history and updated the history  as necessary .   education given as to diagnosis and treatment, no barriers to learning ,understanding demonstrated  signed electronically   Ferdinand Lango. MD  Attending Physician

## 2009-08-02 ENCOUNTER — Ambulatory Visit

## 2009-08-02 NOTE — Nursing Note (Signed)
>>   [name]     Wed Aug 02, 2009 11:42 AM  Per orders of Dr. Darol Destine injection of TESTOSTERONE 100MG  WAS given by Nils Pyle. Patient instructed to remain in clinic for 20 minutes afterwards, and to report any adverse reaction to me immediately.  Order and injection verified by Dr. Caryl Never.

## 2009-08-14 ENCOUNTER — Ambulatory Visit: Admitting: FAMILY PRACTICE

## 2009-08-14 VITALS — BP 122/74 | HR 76 | Temp 98.1°F | Resp 16 | Wt 227.1 lb

## 2009-08-14 MED ORDER — DOXYCYCLINE MONOHYDRATE 100 MG TABLET
1.0000 | ORAL_TABLET | Freq: Two times a day (BID) | ORAL | Status: AC
Start: 2009-08-14 — End: 2009-08-24

## 2009-08-14 NOTE — Nursing Note (Signed)
>>   ARDELLE Lorin Glass     Mon Aug 14, 2009  4:00 PM  Vital signs taken, allergies verified and screened for pain.  Nell Range, MA

## 2009-08-15 NOTE — Progress Notes (Signed)
Chief Complaint: Sinus Problem and Congestion      SUBJECTIVE:    Alex Werner is a 44yr old male who is here for follow up  Still has thick phlegm   Wheezing taken care of with flovent, albuterol inhaler -   But does not feel well yet   Was given zithromax 2/9 for sinusitus and those symptoms seem better  No fever   Tired     ros:  Except for the above mentioned symptoms he reports no other constitutional, respiratory, cardiovascular, gi, urinary, musculoskeletal or dermatological problems today.    Allergies  Allergies   Allergen Reactions    No Known Allergies Other-Reaction in Comments     N/A         Albuterol (PROAIR HFA) 90 mcg/Actuation inhaler, Take 1 Puff by inhalation every 4 to 6 hours if needed.  Budesonide Nasal (RHINOCORT AQUA) 32 mcg/Actuation Spry Spray, Instill 2 Sprays into EACH nostril every day. (allergies)  Citalopram (CELEXA) 20 mg PO Tablet, Take 1 Tab by mouth every day.  Doxycycline (ADOXA) 100 mg tablet, Take 1 Tab by mouth 2 times daily.  Fluticasone (FLOVENT HFA) 110 mcg/Actuation Inhaler, Take 1 Puff by inhalation every 12 hours.        OBJECTIVE:  BP 122/74  Pulse 76  Temp(Src) 36.7 C (98.1 F) (Tympanic)  Resp 16  Wt 102.995 kg (227 lb 1 oz)    Pt appears in no distress,    healthy, alert and cooperative    Eye exam - unremarkable  ENT exam reveals - both sides TM normal without fluid or infection, neck without nodes, throat normal without erythema or exudate and sinuses nontender.  Chest is clear, no wheezing or rales. Normal symmetric air entry throughout both lung fields. No chest wall deformities or tenderness.  Heart - regular rate and rhythm without murmur  Skin - no rash      Office tests/procedures:  None   Previous lab tests/Xrays/reports reviewed: none       Assessment and Plan:    493.90 ASTHMA UNSPECIFIED  (primary encounter diagnosis)  Comment: continue inhalers   Plan: Doxycycline (ADOXA) 100 mg tablet        Follow up if worsens or does not improve     466.0  Acute bronchitis  Comment:   Plan: Doxycycline (ADOXA) 100 mg tablet              Medical, surgical, social and family hx reviewed and the medical record updated where appropriate.  Education was given regarding diagnosis and treatment; understanding demonstrated with no barriers.      Electronically Signed By:    Yehuda Budd, MD  Associate Physician  [facility]  [facility] [place] [facility]

## 2009-08-16 ENCOUNTER — Ambulatory Visit

## 2009-08-16 NOTE — Nursing Note (Signed)
>>   Alex Werner     Wed Aug 16, 2009 11:38 AM  Per orders of Dr. Darol Destine injection of TESTOSTERONE 100MG  WAS given by Nils Pyle. Patient instructed to remain in clinic for 20 minutes afterwards, and to report any adverse reaction to me immediately.  Order and injection verified by Dr. Ricke Hey

## 2009-08-30 ENCOUNTER — Encounter

## 2009-09-06 ENCOUNTER — Encounter

## 2009-09-07 ENCOUNTER — Ambulatory Visit

## 2009-09-07 NOTE — Nursing Note (Signed)
>>   Alex Werner     Thu Sep 07, 2009  3:50 PM  The following procedures were done per physician's order:  Depo Testosterone injection given per orders.YGUSLISTOV,MA  Order and medication dosage verified by Dr.Jue.YGUSLISTOV,MA

## 2009-09-20 ENCOUNTER — Ambulatory Visit

## 2009-09-20 NOTE — Nursing Note (Signed)
>>   [name]     Wed Sep 20, 2009  2:57 PM  Per orders of Dr. Darol Destine injection of TESTOSTERONE 100MG  WAS given by Nils Pyle. Patient instructed to remain in clinic for 20 minutes afterwards, and to report any adverse reaction to me immediately.  Order and injection verified by Dr. Odis Luster.

## 2009-10-04 ENCOUNTER — Encounter

## 2009-10-11 ENCOUNTER — Ambulatory Visit

## 2009-10-11 NOTE — Nursing Note (Signed)
>>   Alex Werner     Wed Oct 11, 2009  2:39 PM  Testosterone given per  Orders.Medication and order Verified by Dr Caryl Never.  Alex Saa, MA

## 2009-10-25 ENCOUNTER — Ambulatory Visit

## 2009-10-25 NOTE — Nursing Note (Signed)
>>   Alex Werner Alex Werner     Wed Oct 25, 2009 11:35 AM  Testosterone 100mg  injection given per Dr. Orion Modest order.  The injection was verified by Dr. Caryl Never.  Nell Range, M.A.

## 2009-10-26 ENCOUNTER — Ambulatory Visit: Admitting: Family Medicine

## 2009-10-26 VITALS — BP 128/80 | HR 74 | Temp 97.7°F | Ht 70.0 in | Wt 227.5 lb

## 2009-10-26 NOTE — Nursing Note (Signed)
>>   Alex Werner     Thu Oct 26, 2009  9:20 AM  Vital signs taken, allergies verified, screened for pain .  Sherrie Mustache, RMA

## 2009-10-26 NOTE — Progress Notes (Signed)
Chief Complaint :  Chief Complaint   Patient presents with    Lab Slip Request     follow up       S:  Alex Werner is a 31yr male .   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  8.5 g  6    Budesonide Nasal (RHINOCORT AQUA) 32 mcg/Actuation Spry Spray Instill 2 Sprays into EACH nostril every day. (allergies)  8.6 g  12    Citalopram (CELEXA) 20 mg PO Tablet Take 1 Tab by mouth every day.  30 Tab  12    Fluticasone (FLOVENT HFA) 110 mcg/Actuation Inhaler Take 1 Puff by inhalation every 12 hours.  12 g  6       allergies    No Known Allergies    Other-Reaction in Comments    Comment:N/A  Past Surgical History   Procedure Date    Vasectomy, unilat/bilat, w/postop semen exam 12/06/05     Vasectomy       Family History   Problem Relation Age of Onset    Asthma Father     Heart Mother      arrythmia        History   Social History    Marital Status: MARRIED     Spouse Name: N/A     Number of Children: 2    Years of Education: N/A   Social History Main Topics    Smoking status: Never Smoker     Smokeless tobacco: Not on file    Alcohol Use: 1.0 oz/week    Drug Use: No    Sexually Active: Yes -- Male partner(s)   Other Topics Concern    Not on file   Social History Narrative     pmh - asthma , rectal bleeding, infertility ( couple ) ,rt shoulder pain , low testosterone ,gerd rosacea , lumbago   surgery - vasectomy   meds-  see med list   allergies none   habits- non smoking , occ etoh , not  exercising   sh- married  2 childern ,   fam hx - father with asthma , mom has irregular heart beats,         O:   BP 128/80  Pulse 74  Temp(Src) 36.5 C (97.7 F) (Tympanic)  Ht 1.778 m (5\' 10" )  Wt 103.193 kg (227 lb 8 oz)  BMI 32.64 kg/m2  Filed Vitals:    10/26/2009   9:19 AM   PainSc: No pain     GENERAL APPEARANCE: appearing stated age in no acute distress         A:  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  (primary encounter diagnosis)  Comment:   Plan: COMPREHENSIVE METABOLIC PANEL, CBC AUTO +         REFLEX MANUAL DIFF, TESTOSTERONE,BIOAVAIL  MALE>17 , continue current dose of med for now pending results         bill on time counselling and coordination of care  .  I spent a total of   with the patient , greater than  50% was counseling the patient on the above.        P:  SEE EPICARE ORDERS  FOR DOCUMENTATION OF DIAGNOSTIC AND THERAPEUTIC ORDERS  FOR THIS VISIT       I did review with the patient or patient's caretaker  the  patient's past medical and family/social history and updated the history  as necessary .   education given as to diagnosis and treatment, no barriers to learning ,understanding demonstrated  signed electronically   Ferdinand Lango. MD  Attending Physician

## 2009-11-01 ENCOUNTER — Encounter

## 2009-11-08 ENCOUNTER — Ambulatory Visit

## 2009-11-08 NOTE — Nursing Note (Signed)
>>   Alex Werner     Wed Nov 08, 2009 10:20 AM  Testosterone 100mg  injection given per Dr. Orion Modest order.  The injection was verified by Dr. Caryl Never.  Alex Werner, M.A.

## 2009-11-15 ENCOUNTER — Ambulatory Visit

## 2009-11-15 ENCOUNTER — Encounter

## 2009-11-22 ENCOUNTER — Ambulatory Visit

## 2009-11-22 NOTE — Nursing Note (Signed)
>>   Theresa Duty, MA     Wed Nov 22, 2009  2:20 PM  The following procedures were done per physician's order:  testrosterone 100 mg injection given per orders    Injection shown to pcp Dr Caryl Never  .Antony Salmon MA.    100 mg wasted  Antony Salmon MA.

## 2009-12-06 ENCOUNTER — Ambulatory Visit

## 2009-12-06 NOTE — Nursing Note (Signed)
>>   [name]     Wed Dec 06, 2009  2:22 PM  Per orders of Dr. Darol Destine injection of TESTOSTERONE 100MG  WAS given by Nils Pyle. Patient instructed to remain in clinic for 20 minutes afterwards, and to report any adverse reaction to me immediately.  Order and injection verified by Dr. Caryl Never.

## 2009-12-20 ENCOUNTER — Ambulatory Visit

## 2009-12-20 NOTE — Nursing Note (Signed)
>>   Alex Werner     Wed Dec 20, 2009  2:55 PM  The following procedures were done per physician's order: Depo Testosterone injection given per orders.YGUSLISTOV,MA  Order and medication dosage verified by Dr. Odis Luster.Valentino Hue

## 2009-12-28 ENCOUNTER — Other Ambulatory Visit: Payer: Self-pay | Admitting: Family Medicine

## 2009-12-28 MED ORDER — CITALOPRAM 20 MG TABLET
20.0000 mg | ORAL_TABLET | Freq: Every day | ORAL | Status: DC
Start: 2009-12-28 — End: 2011-01-10

## 2010-01-03 ENCOUNTER — Ambulatory Visit

## 2010-01-03 NOTE — Nursing Note (Signed)
>>   Alex Werner     Wed Jan 03, 2010  3:02 PM  Per orders of Dr.JUE,AN injection of TESTOSTERONE 100MG  WAS given by Nils Pyle. Patient instructed to remain in clinic for 20 minutes afterwards, and to report any adverse reaction to me immediately.  Order and injection verified by Dr. Caryl Never.

## 2010-05-03 ENCOUNTER — Ambulatory Visit

## 2010-05-04 ENCOUNTER — Encounter

## 2010-05-04 ENCOUNTER — Encounter: Payer: Self-pay | Admitting: Family Medicine

## 2010-05-04 ENCOUNTER — Ambulatory Visit: Admitting: Family Medicine

## 2010-05-04 VITALS — BP 110/70 | HR 72 | Temp 98.0°F | Wt 227.4 lb

## 2010-05-04 DIAGNOSIS — F419 Anxiety disorder, unspecified: Secondary | ICD-10-CM | POA: Insufficient documentation

## 2010-05-04 DIAGNOSIS — F32A Depression, unspecified: Secondary | ICD-10-CM | POA: Insufficient documentation

## 2010-05-04 NOTE — Nursing Note (Signed)
>>   Alex Werner     Fri May 04, 2010  8:48 AM  Per orders of Dr. Caryl Never, injection of Testosterone given by Sherrie Mustache, RMA. Patient instructed to remain in clinic for 20 minutes afterwards, and to report any adverse reaction to me immediately.  Injection verified by Dr. Galvin Proffer, RMA    >> [facility] Werner     Caleen Essex May 04, 2010  8:21 AM  Immunization VIS documentation(s) were given to Alex Werner to review. All questions were answered and the patient consented to the Immunization(s) being given. Patient allergies were reviewed and no contraindications were found. The immunization(s) were given as ordered. The patient was observed for any immediate reactions to the vaccine. None were observed.  Injection verified by Dr. Galvin Proffer, RMA    >> [facility] Werner     Caleen Essex May 04, 2010  8:12 AM  Vital signs taken, allergies verified, screened for pain and tobacco history reviewed.  Sherrie Mustache, RMA

## 2010-05-04 NOTE — Progress Notes (Signed)
Chief Complaint :  Chief Complaint   Patient presents with    Anxiety     follow up    Lab Slip Request     ins       S:  Alex Werner is a 44yr male .   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  8.5 g  6    Budesonide Nasal (RHINOCORT AQUA) 32 mcg/Actuation Spry Spray Instill 2 Sprays into EACH nostril every day. (allergies)  8.6 g  12    Citalopram (CELEXA) 20 mg Tablet Take 1 Tab by mouth every day.  30 Tab  12    Fluticasone (FLOVENT HFA) 110 mcg/Actuation Inhaler Take 1 Puff by inhalation every 12 hours.  12 g  6       allergies    No Known Allergies    Other-Reaction in Comments    Comment:N/A  Past Surgical History   Procedure Date    Vasectomy, unilat/bilat, w/postop semen exam 12/06/05     Vasectomy       Family History   Problem Relation Age of Onset    Asthma Father     Heart Mother      arrythmia        History   Social History    Marital Status: MARRIED     Spouse Name: N/A     Number of Children: 2    Years of Education: N/A   Social History Main Topics    Smoking status: Never Smoker     Smokeless tobacco: Not on file    Alcohol Use: 1.0 oz/week    Drug Use: No    Sexually Active: Yes -- Male partner(s)   Other Topics Concern    Not on file   Social History Narrative     pmh - asthma , rectal  bleeding, infertility ( couple ) ,rt shoulder pain , low testosterone ,gerd rosacea , lumbago   surgery - vasectomy   meds-  see med list   allergies none   habits- non smoking , occ etoh , not exercising   sh- married  2 childern ,   fam hx - father with asthma , mom has irregular heart beats,         O:   BP 110/70  Pulse 72  Temp(Src) 36.7 C (98 F) (Tympanic)  Wt 103.148 kg (227 lb 6.4 oz)  Filed Vitals:    05/04/10  0810   PainSc: No pain     GENERAL APPEARANCE: appearing stated age in no acute distress     A:  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  (primary  encounter diagnosis)  Comment:   Plan: TESTOSTERONE INJ, TESTOSTERONE,BIOAVAIL         MALE>17, TESTOSTERONE INJ        will check baseline testosterone off med before injection given     V77.91G Screening, lipid  Comment:   Plan: COMPREHENSIVE METABOLIC PANEL, LIPID PANEL WITH        DLDL REFLEX            V77.1 Screening for diabetes mellitus  Comment:   Plan: COMPREHENSIVE METABOLIC PANEL,         V04.81V Influenza vaccine needed  Comment:   Plan: INFLUENZA VACCINE (3 YO AND OLDER), LATEX , NO         PRESERVATIVE              300.00E Anxiety  Comment:   Plan: discussed the use of antianxiety medications , the various medications available and potential side effects and the reason for the current selection of medication , supportative counselling given ,   will stay on current dose of celexa for now  ,  consider counselling   , consider adding buspar if necesary    311F Depression  Comment:   Plan: discussed the use of antidepressant medications , the various medications available and potential side effects and the reason for the current selection of medication , supportative counselling given ,   will stay on current dose of celexa for now consider counselling , consider increasing dose if necessary      bill on time counselling and coordination of care  .  I spent a total of   with the patient , greater than  50% was counseling the patient on the  above.     P:  SEE EPICARE ORDERS  FOR DOCUMENTATION OF DIAGNOSTIC AND THERAPEUTIC ORDERS  FOR THIS VISIT     I did review with the patient or patient's caretaker  the  patient's past medical and family/social history and updated the history  as necessary .   education given as to diagnosis and treatment, no barriers to learning ,understanding demonstrated  signed electronically   Ferdinand Lango. MD  Attending Physician

## 2010-05-08 ENCOUNTER — Encounter: Payer: Self-pay | Admitting: Family Medicine

## 2010-05-08 NOTE — Telephone Encounter (Signed)
From: Rebeca Allegra   To: Barbaraann Faster, MD   Sent: Mon May 07, 2010 9:40 PM   Subject: Visit Follow-up Question    Follow up on appointment 05/04/10. I provided you a form you have to fill in, sign and return for my company's healthcare program. The form is in your dead file and has a Product manager on the form.

## 2010-08-12 ENCOUNTER — Other Ambulatory Visit: Payer: Self-pay | Admitting: Family Medicine

## 2010-08-12 NOTE — Telephone Encounter (Signed)
This is a  REFILL AUTHORIZATION for the faxed request for prescription number [phone].Last date filled 12/04/2009.

## 2010-08-12 NOTE — Telephone Encounter (Signed)
This is a  REFILL AUTHORIZATION for the faxed request for prescription number [phone].Last date filled 02/21/2010.  Flovent-This is a  REFILL AUTHORIZATION for the faxed request for prescription number [phone].Last date filled 08/24/2009.

## 2010-08-13 MED ORDER — ALBUTEROL SULFATE HFA 90 MCG/ACTUATION AEROSOL INHALER: 1.0000 | INHALATION_SPRAY | RESPIRATORY_TRACT | Status: DC | PRN

## 2010-08-13 MED ORDER — BUDESONIDE 32 MCG/ACTUATION NASAL SPRAY,AEROSOL
2.0000 | INHALATION_SPRAY | Freq: Two times a day (BID) | NASAL | Status: DC
Start: 2010-08-12 — End: 2012-03-27

## 2010-08-13 MED ORDER — FLUTICASONE 110 MCG/ACTUATION AEROSOL ORAL INHALER
1.0000 | INHALATION_SPRAY | Freq: Two times a day (BID) | RESPIRATORY_TRACT | Status: DC
Start: 2010-08-12 — End: 2011-08-13

## 2010-08-20 NOTE — Progress Notes (Addendum)
the  lab orders are in the computer, you need to call  [phone] to    arrange an appt in our lab for the tests to be done

## 2010-08-20 NOTE — Progress Notes (Addendum)
Barbaraann Faster, MD   [facility]   [address]             November 25, 2003 RE: Alex, Werner   MR#: [id number]   DOB: Jan 02, 1966   DOV: 11/25/2003      Dear Dr. Caryl Never:    The patient is a 45 year old man with a 66 year old wife. This is their first marriage. They have been married seven years and have two children, the youngest of whom is two years old. They use condoms for birth control and desire permanent sterilization. Informed consent was given for vasectomy after the other methods were discussed.    MEDICAL HISTORY: Medications: Flovent, albuterol, Rhinocort, Androderm. Allergies: none to medications. Operations: none. Illnesses: none. Hospitalizations: none. Family history: positive for vestibular cancer in a maternal uncle. Social history: The patient was born and raised in [place] and came to [place] in [place]. He is married with two children. He does not smoke. He uses 1-2 oz of alcohol a week and 2-4 caffeinated beverages a day. 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    REVIEW OF SYSTEMS: GENERAL: The patient denies fever, chills, weight loss and headaches. HEENT: The patient denies blurred vision, double vision, pain in the eyes, and has no history of recurrent ear infections, sore throats, sinus problems, hearing loss, or dizziness. CARDIO: The patient denies heart attack, chest pain, palpitations, ankle edema, high blood pressure and varicose veins. RESP: asthma. GI: blood in stool, ulcer. GU: The patient denies nocturia, excess day frequency, painful urination, incomplete emptying, hematuria, kidney stones, slowing of the urinary stream and intermittency. SKIN: The patient denies a history of excess skin rash, boils, persistent itch and warts. MUSCULOSKELETAL: The patient denies pains in the joints, neck, and back. The patient denies a history of gout, osteoporosis, and fractures. NEURO: The patient denies a history of tremors, seizures, paralysis, and unusual numbness.  HEME: The patient denies swollen glands, clotting problems, and anemia. PSYCH: The patient is satisfied with life, without depression, and is not considering suicide.    PHYSICAL EXAM: The patient is a well-developed, well-nourished male in no acute distress and oriented x 3. Blood pressure: 120/82, pulse: 68, respirations: 20. Abdominal exam is unremarkable. The testes are descended, without masses. The vasa are palpated. The phallus is normal. Extremities: no cyanosis or edema.     IMPRESSION: Undesired fertility.    RECOMMENDATIONS: The patient will undergo vasectomy. Informed consent was given. The patient was educated on all aspects of the plan of care and verbalized understanding of the plan of care.    Sincerely,    THIS WAS ELECTRONICALLY SIGNED - 11/26/2003 11:37 AM PST BY: Lelon Huh, MD  ASSOCIATE CLINICAL PROFESSOR  DEPARTMENT OF UROLOGY    [id number])    D: 11/25/2003 10:04 AM  T: 11/25/2003 02:59 PM  C#: [id number]    cc:   PHYSICIAN/ PROVIDER

## 2010-08-20 NOTE — Progress Notes (Signed)
This Letter is to inform you of some changes in our laboratory services    that we hope will be an improvement to our current process.<br /><br    />On September 24, 2004 we began scheduling patients for their laboratory    work by appointment.  The laboratory hours and appointments begin at    8:00am and continue through 4:30pm, closing for lunch between 12:30 and    1:30pm.<br /><br />Our goal with this change is to give patients the    ability to plan their daily schedules better, knowing they have a set    appointment time for laboratory work.  The appointments will provide a    much quicker processing time.  We will continue to accommodate same day    needs for patients coming directly from physician visits.  Same day    laboratory services will be done as quickly as possible, while    maintaining our appointment schedule.<br /><br />We will be able to    choose a convenient appointment time and can make that appointment while    at your physician visit or by calling the appointment number at    [phone].  You will also get an automated reminder from the [employer]    system to let you know about your upcoming laboratory appointment.<br    /><br />The [facility] Department of Pathology and the [facility]    management have been working together to bring forward positive changes    in our delivery of healthcare.  One way we can do that is be eliminating    the &quot;first come, first serve&quot; system where everyone needing    laboratory tests shows up at the same time and hopes to be processed    quickly,  in reality patients may experience delays and long wait times    that are inconvenient, especially when fasting preparation is    involved.<br /><br />As we initiate this change, we would like to hear    your feedback.  Your thoughts and comments are important.<br /><br    />Thank you for choosing [facility] and the [facility] for your healthcare needs.<br /><br />Sincerely,<br /><br     />Gretchen Tessier, Practice Manager<br />Kaleo Wooddell, Associate    Medical Director

## 2011-01-10 ENCOUNTER — Other Ambulatory Visit: Payer: Self-pay | Admitting: Family Medicine

## 2011-01-10 MED ORDER — CITALOPRAM 20 MG TABLET
20.0000 mg | ORAL_TABLET | Freq: Every day | ORAL | Status: DC
Start: 2011-01-10 — End: 2012-01-20

## 2011-08-13 ENCOUNTER — Other Ambulatory Visit: Payer: Self-pay | Admitting: Family Medicine

## 2011-08-13 MED ORDER — ALBUTEROL SULFATE HFA 90 MCG/ACTUATION AEROSOL INHALER: 2.0000 | INHALATION_SPRAY | RESPIRATORY_TRACT | Status: DC | PRN

## 2011-08-13 MED ORDER — FLUTICASONE 110 MCG/ACTUATION AEROSOL ORAL INHALER
2.0000 | INHALATION_SPRAY | Freq: Two times a day (BID) | RESPIRATORY_TRACT | Status: DC
Start: 2011-08-13 — End: 2012-03-27

## 2011-09-30 ENCOUNTER — Other Ambulatory Visit: Payer: Self-pay | Admitting: Family Medicine

## 2011-09-30 MED ORDER — BUDESONIDE 32 MCG/ACTUATION NASAL SPRAY
2.0000 | Freq: Every day | NASAL | Status: DC
Start: 2011-09-30 — End: 2012-03-27

## 2011-11-05 ENCOUNTER — Telehealth: Payer: Self-pay | Admitting: Family Medicine

## 2011-11-05 MED ORDER — FLUTICASONE 110 MCG/ACTUATION AEROSOL ORAL INHALER
1.0000 | INHALATION_SPRAY | Freq: Two times a day (BID) | RESPIRATORY_TRACT | Status: DC
Start: 2011-11-05 — End: 2014-09-06

## 2012-01-20 ENCOUNTER — Other Ambulatory Visit: Payer: Self-pay | Admitting: Family Medicine

## 2012-01-20 MED ORDER — CITALOPRAM 20 MG TABLET
20.0000 mg | ORAL_TABLET | Freq: Every day | ORAL | Status: DC
Start: 2012-01-20 — End: 2012-03-27

## 2012-01-20 NOTE — Telephone Encounter (Signed)
Called patient. Message read verbatim.

## 2012-01-20 NOTE — Telephone Encounter (Signed)
Left message to call the office.

## 2012-01-20 NOTE — Telephone Encounter (Signed)
notify patient  , that although I have refilled his CELEXA , I have not seen him  in some time and needs follow up appt with me before further refills

## 2012-02-18 ENCOUNTER — Emergency Department (INDEPENDENT_AMBULATORY_CARE_PROVIDER_SITE_OTHER): Payer: BC Managed Care – PPO

## 2012-02-18 ENCOUNTER — Emergency Department (HOSPITAL_COMMUNITY)
Admission: EM | Admit: 2012-02-18 | Discharge: 2012-02-18 | Disposition: A | Discharge status: Home / Self Care | Payer: BC Managed Care – PPO | Source: Home / Self Care | Attending: Emergency Medicine | Admitting: Emergency Medicine

## 2012-02-18 ENCOUNTER — Encounter (HOSPITAL_COMMUNITY): Payer: Self-pay | Admitting: Emergency Medicine

## 2012-02-18 DIAGNOSIS — M1990 Unspecified osteoarthritis, unspecified site: Secondary | ICD-10-CM

## 2012-02-18 DIAGNOSIS — M19139 Post-traumatic osteoarthritis, unspecified wrist: Secondary | ICD-10-CM

## 2012-02-18 DIAGNOSIS — M12549 Traumatic arthropathy, unspecified hand: Secondary | ICD-10-CM

## 2012-02-18 HISTORY — DX: Fracture of unspecified carpal bone, right wrist, initial encounter for closed fracture: S62.101A

## 2012-02-18 HISTORY — DX: Essential (primary) hypertension: I10

## 2012-02-18 MED ORDER — DICLOFENAC SODIUM 75 MG PO TBEC: 75.0000 mg | DELAYED_RELEASE_TABLET | Freq: Two times a day (BID) | ORAL | Status: AC

## 2012-02-18 MED ORDER — HYDROCODONE-ACETAMINOPHEN 5-325 MG PO TABS: 2.0000 | ORAL_TABLET | ORAL | Status: AC | PRN

## 2012-02-18 NOTE — ED Notes (Signed)
Pt hit right  wrist on a wall yesterday. Developed large amt of swelling and pain.

## 2012-02-18 NOTE — ED Provider Notes (Addendum)
History     CSN: [id number]  Arrival date & time 02/18/12  4098   First MD Initiated Contact with Patient 02/18/12 [phone]      Chief Complaint  Patient presents with  . Wrist Injury    (Consider location/radiation/quality/duration/timing/severity/associated sxs/prior treatment) HPI Comments: Pt is righthanded male who reports accidentally catching the webspace between the right thumb and index finger on a doorjamb last night. Rolled hand forwards, hitting the dorsum of his wrist on the door. , Reports painful swelling and tenderness at the distal radius, middle of his wrist mild tenderness at the ulnar aspect of his wrist. No numbness tingling or paresthesias. Tried rubbing alcohol, Goody powder without relief. Remote h/o R wrist fx which was managed with a cast. He is not a smoker or diabetic.   ROS as noted in HPI. All other ROS negative.   Patient is a 46 y.o. male presenting with wrist injury. The history is provided by the patient. No language interpreter was used.  Wrist Injury  The incident occurred yesterday. The incident occurred at home. The injury mechanism was a direct blow. The pain is present in the right wrist. The quality of the pain is described as aching and throbbing. The pain has been constant since the incident. The symptoms are aggravated by movement, use and palpation. He has tried aspirin for the symptoms. The treatment provided no relief.    Past Medical History  Diagnosis Date  . Hypertension   . Right wrist fracture     managed with cast    History reviewed. No pertinent past surgical history.  History reviewed. No pertinent family history.  History  Substance Use Topics  . Smoking status: Never Smoker   . Smokeless tobacco: Not on file  . Alcohol Use: Yes     beer occasionally      Review of Systems  Allergies  Review of patient's allergies indicates no known allergies.  Home Medications   Current Outpatient Rx  Name Route Sig Dispense  Refill  . UNKNOWN TO PATIENT      . DICLOFENAC SODIUM 75 MG PO TBEC Oral Take 1 tablet (75 mg total) by mouth 2 (two) times daily. Take with food 30 tablet 0  . HYDROCODONE-ACETAMINOPHEN 5-325 MG PO TABS Oral Take 2 tablets by mouth every 4 (four) hours as needed for pain. 20 tablet 0    BP 122/89  Pulse 62  Temp 97.1 F (36.2 C) (Oral)  Resp 20  SpO2 94%  Physical Exam  Nursing note and vitals reviewed. Constitutional: He is oriented to person, place, and time. He appears well-developed and well-nourished.  HENT:  Head: Normocephalic and atraumatic.  Eyes: Conjunctivae and EOM are normal.  Neck: Normal range of motion.  Cardiovascular: Normal rate.   Pulmonary/Chest: Effort normal. No respiratory distress.  Abdominal: He exhibits no distension.  Musculoskeletal: Normal range of motion.       Right wrist: He exhibits tenderness, bony tenderness and swelling. He exhibits no effusion and no crepitus.       Swelling at distal radius. Skin intact. R  distal radius tender, distal ulnar styloid  tender, snuffbox NT, carpals NT, metacarpals NT, digits NT, Motor intact ability to flex / extend digits of affected hand,  2-point discrimination intact at 5 mm of her entire hand. Pain with radial/ulnar deviation. Able to flex/extend wrist.  CR<2 seconds distally.  Shoulder and upper arm NT, Elbow and proximal forearm NT on affected extremity.  Neurological: He is alert and  oriented to person, place, and time. Coordination normal.  Skin: Skin is warm and dry.  Psychiatric: He has a normal mood and affect. His behavior is normal. Judgment and thought content normal.    ED Course  Procedures (including critical care time)  Labs Reviewed - No data to display Dg Wrist Complete Right  02/18/2012  *RADIOLOGY REPORT*  Clinical Data: Blow to the wrist.  Injury and pain.  RIGHT WRIST - COMPLETE 3+ VIEW  Comparison: None.  Findings: No acute bony or joint abnormality is identified.  The patient has a  chronic scapholunate ligament tear with some proximal migration of the capitate.  The lunate is abnormally sclerotic consistent with osteonecrosis.  The bone is fragmented without displacement.  Multiple loose bodies are present about the wrist. Constellation of findings suggest remote trauma and secondary osteoarthritis.  IMPRESSION:  1.  No acute finding. 2.  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 3.  Osteonecrosis and fragmentation of the lunate. 4.  Severe degenerative disease about the wrist.   Original Report Authenticated By: Bernadene Bell. D'ALESSIO, M.D.      1. DJD (degenerative joint disease)   2. Scapholunate advanced collapse of wrist     MDM  X-ray reviewed by myself. Osteophytes. DJD. No acute changes. Full report per radiology. Placing on thumb spica, and diclofenac, Norco, rest, ice. He'll followup with Dr. Amanda Pea, and started on call in 2 days. Discussed imaging, MDM, plan with patient. Discussed signs and symptoms that should prompt earlier return. Patient agrees with plan.  Luiz Blare, MD 02/18/12 1610  Luiz Blare, MD 02/18/12 1001

## 2012-03-26 ENCOUNTER — Ambulatory Visit

## 2012-03-27 ENCOUNTER — Ambulatory Visit: Admitting: Family Medicine

## 2012-03-27 VITALS — BP 132/80 | HR 68 | Temp 97.4°F | Ht 70.0 in | Wt 213.3 lb

## 2012-03-27 MED ORDER — CITALOPRAM 20 MG TABLET
20.0000 mg | ORAL_TABLET | Freq: Every day | ORAL | Status: DC
Start: 2012-03-27 — End: 2013-06-08

## 2012-03-27 NOTE — Nursing Note (Signed)
>>   Alex Werner     Fri Mar 27, 2012  8:50 AM  Vital signs taken, allergies verified, screened for pain.,   Alex Song, MA

## 2012-03-27 NOTE — Progress Notes (Signed)
Chief Complaint :    Chief Complaint   Patient presents with    Medication Follow Up     S:  Alex Werner is a 4yr male .   here follow up    on celexa  ,   depression and anxiety stable on meds   due for lab follow up   prior history of hypo testosterone that improved  ,is not on treatment wishes lab   is on asthma  meds     is on  nasal cpap for  sleep apnea     Patient Active Problem List   Diagnosis    ESOPHAGEAL REFLUX    TESTICULAR HYPOFUNC NEC    ASTHMA UNSPECIFIED    Lumbago    Sleep apnea    Fatigue    Depression    Anxiety     Current Outpatient Prescriptions on File Prior to Visit   Medication Sig Dispense Refill    Albuterol (PROAIR HFA, PROVENTIL HFA, VENTOLIN HFA) 90 mcg/actuation inhaler Take 2 puffs by inhalation every 4 hours if needed.  8.5 g  6    Citalopram (CELEXA) 20 mg Tablet Take 1 tablet by mouth every day.  30 tablet  2    Fluticasone (FLOVENT HFA) 110 mcg/actuation Inhaler Take 1 puff by inhalation every 12 hours.  12 g  6     No current facility-administered medications on file prior to visit.     allergies    No Known Allergies    Other-Reaction in Comments    Comment:N/A  Past Surgical History   Procedure Laterality Date    Vasectomy, unilat/bilat, w/postop semen exam  12/06/05     Vasectomy     Family History   Problem Relation Age of Onset    Asthma Father     Heart Mother      arrythmia      History     Social History    Marital Status: MARRIED     Spouse Name: N/A     Number of Children: 2    Years of Education: N/A     Social History Main Topics    Smoking status: Never Smoker     Smokeless tobacco: Not on file    Alcohol Use: 1.0 oz/week    Drug Use: No    Sexually Active: Yes -- Male partner(s)     Other Topics Concern    Not on file     Social History Narrative     pmh - asthma , rectal bleeding, infertility ( couple ) ,rt shoulder pain , low testosterone ,gerd     rosacea , lumbago       surgery - vasectomy       meds-  see med list        allergies none       habits- non smoking , occ etoh , not exercising       sh- married  2 childern ,       fam hx - father with asthma , mom has irregular heart beats,         O:   BP 132/80  Pulse 68  Temp(Src) 36.3 C (97.4 F) (Tympanic)  Ht 1.778 m (5\' 10" )  Wt 96.758 kg (213 lb 5 oz)  BMI 30.61 kg/m2  Filed Vitals:    03/27/12  0849   PainSc: No pain   GENERAL APPEARANCE: appearing stated age in no acute distress   CHEST: clear to auscultation  ,  heart nsr no murmur     A:  (311) Depression  (primary encounter diagnosis)  Comment:   Plan: URINALYSIS-COMPLETE, THYROID STIMULATING         HORMONE, THYROXINE, FREE (FREE T4), LIPID PANEL        WITH DLDL REFLEX, COMPREHENSIVE METABOLIC         PANEL, CBC AUTO + REFLEX MANUAL DIFF,         Citalopram (CELEXA) 20 mg Tablet            (780.57) Sleep apnea  Comment:   Plan: continue cpap     (257.2) TESTICULAR HYPOFUNC NEC  Comment:   Plan: TESTOSTERONE,BIOAVAIL MALE>17             P:  SEE EPICARE ORDERS  FOR DOCUMENTATION OF DIAGNOSTIC AND THERAPEUTIC ORDERS  FOR THIS VISIT     I did review with the patient or patient's caretaker  the  patient's past medical and family/social history and updated the history  as necessary .   education given as to diagnosis and treatment, no barriers to learning ,understanding demonstrated  signed electronically   Ferdinand Lango. MD  Attending Physician

## 2012-04-15 ENCOUNTER — Ambulatory Visit

## 2012-04-15 NOTE — Nursing Note (Signed)
>>   Abbott Pao, LVN     Wed Apr 15, 2012  9:23 AM  The Influenza Vaccine VIS document for the flu injection was given to patient to review. Patient or person named in permission has answered no to any history of egg allergy, previous serious reaction to a influenza vaccine or current illness which would preclude them receiving an immunization. Any questions were referred to the physician. The Influenza Vaccine was then administered per protocol. The patient was observed for immediate reactions to the vaccine per protocol. None were observed.    Abbott Pao, LVN

## 2012-09-22 ENCOUNTER — Other Ambulatory Visit: Payer: Self-pay | Admitting: Family Medicine

## 2012-09-22 MED ORDER — ALBUTEROL SULFATE HFA 90 MCG/ACTUATION AEROSOL INHALER: 2.0000 | INHALATION_SPRAY | RESPIRATORY_TRACT | Status: DC | PRN

## 2013-02-22 ENCOUNTER — Other Ambulatory Visit: Payer: Self-pay

## 2013-02-27 ENCOUNTER — Other Ambulatory Visit: Payer: Self-pay

## 2013-03-06 ENCOUNTER — Other Ambulatory Visit: Payer: Self-pay

## 2013-03-20 ENCOUNTER — Other Ambulatory Visit: Payer: Self-pay

## 2013-03-29 ENCOUNTER — Other Ambulatory Visit: Payer: Self-pay | Admitting: Family Medicine

## 2013-05-01 ENCOUNTER — Other Ambulatory Visit: Payer: Self-pay

## 2013-06-08 ENCOUNTER — Other Ambulatory Visit: Payer: Self-pay | Admitting: Family Medicine

## 2013-06-20 ENCOUNTER — Other Ambulatory Visit: Payer: Self-pay | Admitting: Family Medicine

## 2013-07-11 ENCOUNTER — Ambulatory Visit: Payer: Self-pay | Admitting: Family Medicine

## 2013-07-11 VITALS — BP 128/80 | HR 65 | Temp 98.1°F | Resp 16 | Ht 68.5 in | Wt 316.8 lb

## 2013-07-11 DIAGNOSIS — Z0289 Encounter for other administrative examinations: Secondary | ICD-10-CM

## 2013-07-11 NOTE — Progress Notes (Addendum)
Subjective:    Patient ID: Rebeca AllegraChristopher Wadley, male    DOB: 1965-10-11, 48 y.o.   MRN: [id number] This chart was scribed for Meredith StaggersJeffrey Meliyah Simon, MD by Valera CastleSteven Perry, ED Scribe. This patient was seen in room 04 and the patient's care was started at 11:37 AM.  Chief Complaint  Patient presents with  . Annual Exam    DOT   HPI Rebeca AllegraChristopher Burrill is a 48 y.o. male Pt requests DOT physical. He reports this is a renewal, last physical was 2 years ago, and physical had no restrictions. He reports taking Lisinopril HDTZ for h/o HTN for 2 years. He reports he has appointment with doctor for yearly physical. He reports he has gained weight in past 2 years. He denies DM, but reports family h/o DM. He denies h/o heart problems, glaucoma, hernias. He denies glare while driving at night. He denies chest pain, SOB, ear pain, visual disturbance arthralgias, myalgias, and any other associated symptoms.   PCP - No primary provider on file.  There are no active problems to display for this patient.  Past Medical History  Diagnosis Date  . Hypertension   . Right wrist fracture     managed with cast   History reviewed. No pertinent past surgical history. No Known Allergies Prior to Admission medications   Medication Sig Start Date End Date Taking? Authorizing Provider  lisinopril-hydrochlorothiazide (PRINZIDE,ZESTORETIC) 20-12.5 MG per tablet Take 1 tablet by mouth daily.   Yes Historical Provider, MD  UNKNOWN TO PATIENT     Historical Provider, MD   History   Social History  . Marital Status: Single    Spouse Name: N/A    Number of Children: N/A  . Years of Education: N/A   Occupational History  . Not on file.   Social History Main Topics  . Smoking status: Never Smoker   . Smokeless tobacco: Not on file  . Alcohol Use: Yes     Comment: beer occasionally  . Drug Use: No  . Sexual Activity: Not on file   Other Topics Concern  . Not on file   Social History Narrative  . No narrative on  file   No Known Allergies  Review of Systems  Constitutional: Negative for fever.  HENT: Negative for ear pain.   Eyes: Negative for visual disturbance.  Respiratory: Negative for shortness of breath.   Cardiovascular: Negative for chest pain.  Musculoskeletal: Negative for arthralgias and myalgias.  All other systems reviewed and are negative.      Objective:   Physical Exam  Vitals reviewed. Constitutional: He is oriented to person, place, and time. He appears well-developed and well-nourished.  HENT:  Head: Normocephalic and atraumatic.  Right Ear: External ear normal.  Left Ear: External ear normal.  Mouth/Throat: Oropharynx is clear and moist.  Eyes: Conjunctivae and EOM are normal. Pupils are equal, round, and reactive to light.  Neck: Normal range of motion. Neck supple. No thyromegaly present.  Cardiovascular: Normal rate, regular rhythm, normal heart sounds and intact distal pulses.   Pulmonary/Chest: Effort normal and breath sounds normal. No respiratory distress. He has no wheezes.  Abdominal: Soft. He exhibits no distension. There is no tenderness. Hernia confirmed negative in the right inguinal area and confirmed negative in the left inguinal area.  Musculoskeletal: Normal range of motion. He exhibits no edema and no tenderness.  Lymphadenopathy:    He has no cervical adenopathy.  Neurological: He is alert and oriented to person, place, and time. He has normal  reflexes.  Skin: Skin is warm and dry.  Psychiatric: He has a normal mood and affect. His behavior is normal.   BP 128/80  Pulse 65  Temp(Src) 98.1 F (36.7 C) (Oral)  Resp 16  Ht 5' 8.5" (1.74 m)  Wt 316 lb 12.8 oz (143.7 kg)  BMI 47.46 kg/m2  SpO2 95%     Assessment & Plan:   Montrice Gracey is a 48 y.o. male Health examination of defined subpopulation Overweight, but no acute findings otherwise. HTN - 1 year card. See ppwk.   Meds ordered this encounter  Medications  .  lisinopril-hydrochlorothiazide (PRINZIDE,ZESTORETIC) 20-12.5 MG per tablet    Sig: Take 1 tablet by mouth daily.   Patient Instructions  1 year card. Keep follow up with your primary care doctor.

## 2013-07-11 NOTE — Patient Instructions (Signed)
1 year card. Keep follow up with your primary care doctor.

## 2013-10-26 ENCOUNTER — Other Ambulatory Visit: Payer: Self-pay | Admitting: Family Medicine

## 2013-10-26 DIAGNOSIS — J45909 Unspecified asthma, uncomplicated: Principal | ICD-10-CM

## 2013-10-26 MED ORDER — ALBUTEROL SULFATE HFA 90 MCG/ACTUATION AEROSOL INHALER: 2.0000 | INHALATION_SPRAY | RESPIRATORY_TRACT | Status: DC | PRN

## 2014-03-16 ENCOUNTER — Other Ambulatory Visit: Payer: Self-pay | Admitting: Family Medicine

## 2014-09-06 ENCOUNTER — Telehealth: Payer: Self-pay | Admitting: Gastroenterology

## 2014-09-06 ENCOUNTER — Encounter: Payer: Self-pay | Admitting: Gastroenterology

## 2014-09-06 ENCOUNTER — Ambulatory Visit: Payer: Commercial Managed Care - PPO | Attending: Internal Medicine | Admitting: Gastroenterology

## 2014-09-06 VITALS — BP 133/91 | HR 53 | Temp 98.3°F | Ht 70.0 in | Wt 233.0 lb

## 2014-09-06 DIAGNOSIS — J45909 Unspecified asthma, uncomplicated: Principal | ICD-10-CM | POA: Insufficient documentation

## 2014-09-06 MED ORDER — ALBUTEROL SULFATE HFA 90 MCG/ACTUATION AEROSOL INHALER: 2.0000 | INHALATION_SPRAY | RESPIRATORY_TRACT | Status: DC | PRN

## 2014-09-06 MED ORDER — FLUTICASONE 110 MCG/ACTUATION AEROSOL ORAL INHALER
1.0000 | INHALATION_SPRAY | Freq: Two times a day (BID) | RESPIRATORY_TRACT | Status: DC
Start: 2014-09-06 — End: 2015-06-21

## 2014-09-06 NOTE — Progress Notes (Signed)
Internal Medicine Urgent Care Note  Alex Werner is a 49yr old male    Chief Complaint:  Asthma, medication refill    HPI:   Alex Werner is a 49 yo man with past hx significant for GERD and asthma who presents for asthma medication refill since prior PCP retired recently.     He was diagnosed with asthma at age 25, overall symptoms are well controlled. His asthmatic attacks have been triggered by exercise as well as seasonal allergies. He usually gets a four month long flare in the spring time, when his allergic rhinitic symptoms start, and he has usually had good control with initiation of the ICS during this time.  He requests refill on his fluticasone today.  His most recent bout was trigger by a recent ski trip, d/t the cold and exercise, and since then he's had to use up to 10-15 times of albuterol inhaler in a given day for the wheezing.  He denies infectious symptoms or dyspnea.     Review of Systems:  Const: no fever, chills, night sweats, or weight loss  Eyes: no change in vision  CV: no chest pain, edema, or palpitation   Resp:no SOB,cough, + some wheeze  GI: no abd pain, diarrhea, constipation    GU: no dysuria  Msk: negative.       Psych: euthymic  Endocrine: no polyuria or polydipsia     Neuro: No headache or numbness    Medications:  Medication reconciliation was performed today.   (put a red check mark to any medication the patient was taking on arrival to the clinic, then type dot-actmed to bring that list in)  Outpatient Prescriptions Marked as Taking for the 09/06/14 encounter (Office Visit) with Jeanette Caprice, MD   Medication Sig Dispense Refill    Albuterol (PROAIR HFA, PROVENTIL HFA, VENTOLIN HFA) 90 mcg/actuation inhaler Take 2 puffs by inhalation every 4 hours if needed. 8.5 g 6    Budesonide (RHINOCORT AQUA) 32 mcg/actuation Non-Aerosol Spray Spray USE 2 SPRAYS IN EACH NOSTRIL EVERY DAY 806 mL 5    Fluticasone (FLOVENT HFA) 110 mcg/actuation Inhaler Take 1 puff by inhalation every 12  hours. 12 g 6      PMH:  Past medical history was reviewed from problem list.     PE:  BP 133/91 mmHg  Pulse 53  Temp(Src) 36.8 C (98.3 F) (Oral)  Ht 1.778 m (5\' 10" )  Wt 105.688 kg (233 lb)  BMI 33.43 kg/m2  SpO2 96%  Gen: vitals reviewed; pleasant, NAD  HEENT: EOMI, pupils equal, sclera anicteric; moist mucous membranes, no lesions  Neck: supple, no masses or thyromegaly  Lymph: no cervical or supraclavicular adenopathy  Heart: normal rate regular rhythm, no murmurs; no bilateral LE edema  Lungs: clear to auscultation bilaterally, no crackles or wheezes; normal effort  Abd: soft, nontender, nondistended; no HSM  Ext: No clubbing, distal pulses equal bilateral   Mental Status: alert and oriented x 3; normal affect    Labs/Tests:   Lab Results   Lab Name Value Date/Time    WBC 5.7 04/15/2012 09:05 AM    HGB 14.8 04/15/2012 09:05 AM    HCT 44.1 04/15/2012 09:05 AM    PLT 168 04/15/2012 09:05 AM       Assessment/Plan    (J45.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 Will restart  ICS, with albuterol inhalers as needed. No resp distress or wheezing heard on exam today. Patient to follow up with PCP in early May appt.   Plan: Fluticasone (FLOVENT HFA) 110 mcg/actuation         Inhaler, Albuterol (PROAIR HFA, PROVENTIL HFA,         VENTOLIN HFA) 90 mcg/actuation inhaler            Health care maintenance:  Up to date.      EDUCATION:  I educated/instructed the patient or caregiver regarding all aspects of the above stated plan of care.  The patient or caregiver indicated understanding.      [place] interpreter was not used.    Electronically signed by:   Aleatha Borer, MD  Internal Medicine PG-2  [place]  Pager: [phone]

## 2014-09-06 NOTE — Telephone Encounter (Signed)
Patient called in stating that he would be a new patient to our clinic and he did book and appointment in May of 2016 with Dr. Jac Canavan, however, he states that he has been having symptoms of wheezing for 2 weeks, fluid in the lungs and is coughing a lot. Since his previous MD retired, he is running low on medication refills as well and wanted to know if he can get a sooner appointment in our clinic. He can be reached at [phone].    [place]  [place]

## 2014-09-06 NOTE — Telephone Encounter (Signed)
Pt called requesting an earlier appt. Called both numbers listed on chart with no answer. Left message to call [facility] dept to get triaged.    Georgia Duff RN  [phone]

## 2014-09-06 NOTE — Nursing Note (Signed)
Vital signs taken, allergies verified, screened for pain.    Gracielynn Birkel MA

## 2014-09-06 NOTE — Telephone Encounter (Signed)
SUBJECTIVE/OBJECTIVE:  Alex Werner is a 49yr old male calling about hx of asthma and recent wheezing for serveral days now. The patient also states he was dx with asthma since a child and recent cold/flu has caused his asthma to flare up. He has been wheezing now for few days and has no been able to sleep since he wakes up coughing and wheezing through the night with occasional SOB. Used asthma and wheezing protocols.    Allergies reviewed YES No known allergies    IMPRESSION: Pt sounds congested on the phone and states he has a could/flu currently thus exacerbating his asthma. Pt states he is new pt to [facility] and will keep May 4th appt to establish care. He is using albuterol daily/hourly which helps relieve s/s of asthma/wheezing-Nebulizer or inhaler used more frequently than every 4 hours per protocol. However, pt is out of his steriod-Flow vent- and running low on albuterol. Pt denies fever, gasping for air, difficulty breathing, change in mental status, irrational, confused, unable to breath lying down, must sit up to breathe dusky or blue lips, tongue, face, weakness, listlessness, severe wheezing or cough where his albuterol inhaler is not effective. Pt arranged Urgent Care appt today per asthma and wheezing protocols.     PLAN: Pt is scheduled to come into today and be seen in Urgent care for his asthma/wheezing at 1540pm.    The patient DOES indicate he understands, agrees, and feels comfortable with the plan. Advised to call back directly if there are further questions, or if these symptoms fail to improve as anticipated or worsen. Pt also agrees to go to ED or call 911 should s/s get worse or he develops any of the signs we discussed.    Georgia Duff RN  IM Triage [place]  [phone]

## 2014-09-07 NOTE — Progress Notes (Signed)
This patient was seen, evaluated, and care plan was developed with the resident.  I agree with the assessment and plan as outlined in the resident's note.  Report electronically signed by Johnjoseph Rolfe Diane Jamyron Redd, MD. Attending

## 2014-10-19 ENCOUNTER — Ambulatory Visit: Payer: Commercial Managed Care - PPO | Attending: Internal Medicine | Admitting: Gastroenterology

## 2014-10-19 VITALS — BP 138/88 | HR 62 | Ht 70.0 in | Wt 230.9 lb

## 2014-10-19 DIAGNOSIS — I8393 Asymptomatic varicose veins of bilateral lower extremities: Secondary | ICD-10-CM | POA: Insufficient documentation

## 2014-10-19 DIAGNOSIS — R200 Anesthesia of skin: Secondary | ICD-10-CM | POA: Insufficient documentation

## 2014-10-19 DIAGNOSIS — Z0000 Encounter for general adult medical examination without abnormal findings: Secondary | ICD-10-CM | POA: Insufficient documentation

## 2014-10-19 DIAGNOSIS — J45909 Unspecified asthma, uncomplicated: Secondary | ICD-10-CM | POA: Insufficient documentation

## 2014-10-19 DIAGNOSIS — Z23 Encounter for immunization: Secondary | ICD-10-CM | POA: Insufficient documentation

## 2014-10-19 MED ORDER — BUDESONIDE 32 MCG/ACTUATION NASAL SPRAY
1.0000 | Freq: Two times a day (BID) | NASAL | Status: DC
Start: 2014-10-19 — End: 2014-11-18

## 2014-10-19 NOTE — Nursing Note (Signed)
Vital signs taken, allergies verified, screened for pain. Preferred pharmacy verified.  Medication refills needed no.  The patient smokes: no .  Resha Filippone Parkinson, MA

## 2014-10-19 NOTE — Progress Notes (Signed)
Alex Werner is a 49yr old male    Chief Complaint:  Establish Care    HPI: 72 yoM with GERD, OSA on CPAP, and asthma who presents to establish care.    Asthma- states he has adequate control, denying wheezing, coughing, sneezing, nasal irritation. Reports having itchy eyes worsened by recent home renovations. States that in fall/winter has no need for Flovent and Albuterol. In spring/summer, needs Albuterol 3-4x/day and Flovent BID with Rhinocort. Denies feeling off baseline today.    L Lateral Leg Numbness: States that he sleeps on L side due to CPAP adjacent to wife. Denies weakness and tingling. 3-4 weeks ago, began to notice it for 60-120 minutes after waking ~ 2-3x/week without pattern and it feels superficial. Does not wake him from sleep. Denies running or wearing tight clothing (shorts at bedtime)    Varicose Veins: started 6-7 years ago and will cause aching and fatigue. Reports using compression stockings which have reduced symptoms. Currently not interested in laser therapy.     A complete review of systems was completed. All systems were negative except as above in HPI and the following:  General: no weight changes, no fatigue, no weakness, no fever, no chills, no night sweats  Skin: no skin changes, no hair changes, no nail changes; no itching; no rashes; no sores; no lumps; no moles  Head: no trauma, no headache; no nausea; no vomiting, no visual changes  Eyes: no changes in vision  Ears: no hearing loss; no tinnitus; no vertigo; no discharge; no earache  Nose: no rhinorrhea, no stuffiness, no epistaxis  Mouth, Throat, Neck: no bleeding gums, no hoarseness, no sore throat, no swollen neck  Cardiac: no chest pain, no palpitations  Respiratory: no shortness of breath, no wheezes, no coughs, no sputum, no hemoptysis  GI: no changes in appetite, no dysphagia, no diarrhea, no constipation, no melena, no hematochezia, no abdominal pain, no jaundice  Urinary: no changes in frequency, no hesitancy,  no urgency, no polyuria, no dysuria, no hematuria, no nocturia, no incontinence  Vascular: No edema  Neurologic: no loss of sensation, no numbness, no tingling, no tremors, no weakness, no seizures      Medications:  Medication reconciliation was performed today.     Outpatient Prescriptions Marked as Taking for the 10/19/14 encounter (Office Visit) with Gatha Mayer, MD   Medication Sig Dispense Refill    Albuterol (PROAIR HFA, PROVENTIL HFA, VENTOLIN HFA) 90 mcg/actuation inhaler Take 2 puffs by inhalation every 4 hours if needed. 8.5 g 6    Budesonide (RHINOCORT AQUA) 32 mcg/actuation Non-Aerosol Spray Spray Instill 1 spray into EACH nostril 2 times daily. 1 bottle 3    Fluticasone (FLOVENT HFA) 110 mcg/actuation Inhaler Take 1 puff by inhalation every 12 hours. 12 g 6         PMH:  Past medical history was reviewed from problem list.     PE:  BP 138/88 mmHg  Pulse 62  Ht 1.778 m (5\' 10" )  Wt 104.736 kg (230 lb 14.4 oz)  BMI 33.13 kg/m2  SpO2 96%    General Appearance: healthy, alert, no distress, pleasant affect, cooperative.  Eyes:  conjunctivae and corneas clear. EOM's intact. sclerae normal.  Ears:  normal TMs and canal and external inspection of ears show no abnormality.  Nose:  normal.  Mouth: normal.  Heart:  normal rate and regular rhythm, no murmurs, clicks, or gallops.  Lungs: clear to auscultation.  Abdomen: BS normal.  Abdomen soft, non-tender.  No  masses or organomegaly.  Extremities:  Mild nonpitting edema of ankles and distal pulses normal. MAEO. Varicose veins present in bilateral calves.  Mental Status: aaox3    Labs/Tests:   Lab Results   Lab Name Value Date/Time    NA 142 04/15/2012 09:05 AM    K 4.5 04/15/2012 09:05 AM    CL 107 04/15/2012 09:05 AM    CO2 27 04/15/2012 09:05 AM    BUN 18 04/15/2012 09:05 AM    CR 0.87 04/15/2012 09:05 AM    GLU 86 04/15/2012 09:05 AM     Lab Results   Lab Name Value Date/Time    WBC 5.7 04/15/2012 09:05 AM    HGB 14.8 04/15/2012 09:05 AM    HCT 44.1  04/15/2012 09:05 AM    PLT 168 04/15/2012 09:05 AM       Assessment/Plan    Asthma 2/2 Seasonal Allergies: Not clearly staged given daily symptoms with albuterol use in the spring (stage 3) vs. None in winter (Stage 1). Appears well-controlled and not in acute exacerbation based on HPI and exam.    -Albuterol prn  -Flovent BID  -refilled Rhinocort  -Pneumovax given    Intermittent L Lateral Thigh Numbness: unclear etiology. Lacks anterior symptoms to suggest meralgia paresthetica or activity hx to suggest ITB compression. 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    -encouraged journaling symptoms  -suggested foam rolling to loosen soft tissue    Bilateral Varicose Veins: stable. Responding to compression stockings. Patient prefers to continue with conservative mgmt.    HCM:  -BMP/CBC/Lipid/HIV screen ordered      EDUCATION:  I educated/instructed the patient or caregiver regarding all aspects of the above stated plan of care.  The patient or caregiver indicated understanding.      [place] interpreter was not used.      Gaylyn Lambert, MD, M. Ed.  PGY2  Internal Medicine  [place]  PI# [phone]  Pager [phone]

## 2014-10-19 NOTE — Nursing Note (Signed)
Immunization VIS documentation(s) were given to the patient to review. All questions were answered and the patient consented to the Immunization(s) being given. Patient allergies were reviewed and no contraindications were found. The immunization(s) were given as ordered. The patient was observed for any immediate reactions to the vaccine. None were observed.   Irina Okelly, LVN

## 2014-10-19 NOTE — Patient Instructions (Addendum)
Thank you for seeing [place] today. We have refilled your Rhinocort. We have ordered basic labs to update the 2013 ones. We have given you a Pneumovax vaccine to protect you from pneumococcal pneumonia given your history of asthma.     After Discharge Instructions:   Please take all of your medications as prescribed, and take note of any changes from your past out-patient regimen.     Please eat a low sodium diet. Please follow the instructions of Physical and Occupational Therapy in regards to your activities.     If you smoke, please stop for your health. If you drink more than 1 (women) or 2 (men) alcoholic drinks per day, please decrease to a maximum of 1 (women) or 2 (men) drinks daily. One drink means one 1.5 oz shot of liquor, a 4 oz glass of wine, or a 12 oz beer. If you use other illicit substances, please stop for your health.     General Precautions:   Please call your primary care physician, seek medical attention, or return to the ER/call 911 for fever, chills, sweats, chest pain, shortness of breath, abdominal pain, nausea/vomiting/diarrhea, severe constipation, new numbness/tingling/weakness.    Please Note the Following Regarding your Medications:    Outpatient Prescriptions Marked as Taking for the 10/19/14 encounter (Office Visit) with Gatha Mayer, MD   Medication Sig Dispense Refill    Albuterol (PROAIR HFA, PROVENTIL HFA, VENTOLIN HFA) 90 mcg/actuation inhaler Take 2 puffs by inhalation every 4 hours if needed. 8.5 g 6    Budesonide (RHINOCORT AQUA) 32 mcg/actuation Non-Aerosol Spray Spray Instill 1 spray into EACH nostril 2 times daily. 1 bottle 3    Fluticasone (FLOVENT HFA) 110 mcg/actuation Inhaler Take 1 puff by inhalation every 12 hours. 12 g 6

## 2014-10-22 NOTE — Progress Notes (Signed)
TEACHING PHYSICIAN INVOLVEMENT/ATTENDING PHYSICIAN NOTE      Mr. Beller was seen and evaluated, and the care plan was jointly developed with Dr. Gatha Mayer (resident physician).  I agree with the findings and with the assessment and plan as outlined in the resident's note above.      Suzy Bouchard, MD MS [facility]  Health Sciences Clinical Professor of Internal Medicine

## 2014-10-24 MED FILL — pneumococcal 23 polyvalent vaccine 25 mcg/0.5 mL injection solution: INTRAMUSCULAR | Qty: 0.5 | Status: AC

## 2015-03-06 ENCOUNTER — Telehealth: Payer: Self-pay

## 2015-03-06 NOTE — Telephone Encounter (Signed)
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   Please  All [phone]

## 2015-03-08 ENCOUNTER — Ambulatory Visit (INDEPENDENT_AMBULATORY_CARE_PROVIDER_SITE_OTHER): Payer: Self-pay | Admitting: Emergency Medicine

## 2015-03-08 VITALS — BP 118/72 | HR 64 | Temp 98.0°F | Resp 18 | Ht 67.0 in | Wt 320.0 lb

## 2015-03-08 DIAGNOSIS — Z024 Encounter for examination for driving license: Secondary | ICD-10-CM

## 2015-03-08 DIAGNOSIS — Z0289 Encounter for other administrative examinations: Secondary | ICD-10-CM

## 2015-03-08 NOTE — Progress Notes (Signed)
   Subjective:  This chart was scribed for  Roberto Lites MD, by [name], at [facility].  This patient was seen in room 4 and the patient's care was started at 12:16 PM.   Chief Complaint  Patient presents with  . DOT physical     Patient ID: Roberto Price, male    DOB: 31-Dec-1965, 49 y.o.   MRN: [id number]  HPI  HPI Comments: Roberto Price is a 49 y.o. male who presents to the [facility] regarding a question about his DOT physical which was not endorsed.  He had a physical done by Dr. Neva Seat and states that he now needs a form filled out because he would not the paper work that he had given them.  He has no other complaints or concerns today. 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     There are no active problems to display for this patient.  Past Medical History  Diagnosis Date  . Hypertension   . Right wrist fracture     managed with cast   History reviewed. No pertinent past surgical history. No Known Allergies Prior to Admission medications   Medication Sig Start Date End Date Taking? Authorizing Provider  lisinopril-hydrochlorothiazide (PRINZIDE,ZESTORETIC) 20-12.5 MG per tablet Take 1 tablet by mouth daily.   Yes Historical Provider, MD  UNKNOWN TO PATIENT    Yes Historical Provider, MD   Social History   Social History  . Marital Status: Single    Spouse Name: N/A  . Number of Children: N/A  . Years of Education: N/A   Occupational History  . Not on file.   Social History Main Topics  . Smoking status: Never Smoker   . Smokeless tobacco: Not on file  . Alcohol Use: Yes     Comment: beer occasionally  . Drug Use: No  . Sexual Activity: Not on file   Other Topics Concern  . Not on file   Social History Narrative     Review of Systems     Objective:   Physical Exam  CONSTITUTIONAL: He is markedly over weight.   HEAD:  Normocephalic/atraumatic EYES: EOMI/PERRL ENMT: Mucous membranes moist NECK: supple no meningeal signs SPINE/BACK:entire spine nontender CV: S1/S2 noted, no murmurs/rubs/gallops noted LUNGS: Lungs are clear to auscultation bilaterally, no apparent distress ABDOMEN: soft, nontender, no rebound or guarding, bowel sounds noted throughout abdomen GU:no cva tenderness NEURO: Pt is awake/alert/appropriate, moves all extremitiesx4.  No facial droop.   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  I also completed his form for the DMV.  He has no symptoms of sleep apnea and his blood pressure is well controlled on medication. I personally performed the services described in this documentation, which was scribed in my presence. The recorded information has been reviewed and is accurate.

## 2015-03-09 NOTE — Telephone Encounter (Signed)
Ready for pickup. Left message on machine.

## 2015-06-21 ENCOUNTER — Other Ambulatory Visit: Payer: Self-pay | Admitting: Gastroenterology

## 2015-06-21 DIAGNOSIS — J45909 Unspecified asthma, uncomplicated: Principal | ICD-10-CM

## 2015-06-21 MED ORDER — FLUTICASONE 110 MCG/ACTUATION AEROSOL ORAL INHALER
1.0000 | INHALATION_SPRAY | Freq: Two times a day (BID) | RESPIRATORY_TRACT | 6 refills | Status: DC
Start: 2015-06-21 — End: 2017-10-07

## 2015-06-21 NOTE — Telephone Encounter (Signed)
Refill request has been pended to the pharmacist for review per P&T Protocol. 06/21/2015

## 2015-06-21 NOTE — Telephone Encounter (Signed)
Refill authorized per P&T Protocol 06/21/2015

## 2015-10-02 ENCOUNTER — Other Ambulatory Visit: Payer: Self-pay | Admitting: Gastroenterology

## 2015-10-02 DIAGNOSIS — J45909 Unspecified asthma, uncomplicated: Principal | ICD-10-CM

## 2015-10-02 NOTE — Telephone Encounter (Signed)
Refill request has been pended to the pharmacist for review per P&T Protocol. 10/02/2015

## 2015-10-03 ENCOUNTER — Encounter: Payer: Self-pay | Admitting: Gastroenterology

## 2015-10-03 MED ORDER — ALBUTEROL SULFATE HFA 90 MCG/ACTUATION AEROSOL INHALER: 2.0000 | INHALATION_SPRAY | RESPIRATORY_TRACT | 1 refills | Status: DC | PRN

## 2015-10-03 NOTE — Progress Notes (Signed)
Internal Medicine Graduating Resident Chart Update  PCP: Gatha Mayer, MD    I have met this patient (yes/no): yes    Active and pertinent chronic issues:  OSA on CPAP  Asthma with seasonal allergies  GERD    Controlled substance agreement (yes/no): no    Needed follow up from new PCP, urgent issues, other recommendations:  Obtain previously ordered CBC, BMP, lipid panel, and HIV screen    HCM: HIV screen    Specify time needed for next follow-up appointment: 05/2016    Gaylyn Lambert, MD, M. EdElissa Lovett  Internal Medicine  [place]  PI# [phone]  Pager [phone]

## 2015-10-03 NOTE — Telephone Encounter (Signed)
Refill authorized per P&T Protocol 10/03/2015

## 2016-05-24 ENCOUNTER — Other Ambulatory Visit: Payer: Self-pay | Admitting: INTERNAL MEDICINE

## 2016-05-24 DIAGNOSIS — J45909 Unspecified asthma, uncomplicated: Principal | ICD-10-CM

## 2016-05-24 MED ORDER — ALBUTEROL SULFATE HFA 90 MCG/ACTUATION AEROSOL INHALER: 2.0000 | INHALATION_SPRAY | RESPIRATORY_TRACT | 2 refills | Status: DC | PRN

## 2016-05-24 NOTE — Telephone Encounter (Signed)
This is a verbal approval per Dr. Minerva Areola Morales-Carillo / mm 05/24/2016

## 2016-05-30 ENCOUNTER — Telehealth: Payer: Self-pay | Admitting: INTERNAL MEDICINE

## 2016-05-30 NOTE — Telephone Encounter (Signed)
Called and left a voicemail for patient requesting a call back to clinic. Patient is to schedule a follow up appointment with PCP or team for future medication refills. Thank you.    Jacquelyn Garcia  Medical Office Service Coordinator II   Internal Medicine & Specialty Clinics

## 2016-11-09 ENCOUNTER — Encounter (HOSPITAL_COMMUNITY): Payer: Self-pay

## 2016-11-09 ENCOUNTER — Emergency Department (HOSPITAL_COMMUNITY): Payer: BLUE CROSS/BLUE SHIELD

## 2016-11-09 DIAGNOSIS — M25511 Pain in right shoulder: Secondary | ICD-10-CM | POA: Diagnosis present

## 2016-11-09 DIAGNOSIS — Y998 Other external cause status: Secondary | ICD-10-CM | POA: Insufficient documentation

## 2016-11-09 DIAGNOSIS — I10 Essential (primary) hypertension: Secondary | ICD-10-CM | POA: Insufficient documentation

## 2016-11-09 DIAGNOSIS — Y929 Unspecified place or not applicable: Secondary | ICD-10-CM | POA: Diagnosis not present

## 2016-11-09 DIAGNOSIS — S43004A Unspecified dislocation of right shoulder joint, initial encounter: Secondary | ICD-10-CM | POA: Diagnosis not present

## 2016-11-09 DIAGNOSIS — Y939 Activity, unspecified: Secondary | ICD-10-CM | POA: Diagnosis not present

## 2016-11-09 DIAGNOSIS — W108XXA Fall (on) (from) other stairs and steps, initial encounter: Secondary | ICD-10-CM | POA: Insufficient documentation

## 2016-11-09 NOTE — ED Triage Notes (Signed)
Pt states that he slipped and fell on some steps and injured his right shoulder, no LOC, some swelling and deformity noted, positive PMS

## 2016-11-10 ENCOUNTER — Emergency Department (HOSPITAL_COMMUNITY): Payer: BLUE CROSS/BLUE SHIELD

## 2016-11-10 ENCOUNTER — Emergency Department (HOSPITAL_COMMUNITY)
Admission: EM | Admit: 2016-11-10 | Discharge: 2016-11-10 | Disposition: A | Discharge status: Home / Self Care | Payer: BLUE CROSS/BLUE SHIELD | Attending: Emergency Medicine | Admitting: Emergency Medicine

## 2016-11-10 DIAGNOSIS — S43004A Unspecified dislocation of right shoulder joint, initial encounter: Secondary | ICD-10-CM

## 2016-11-10 MED ORDER — HYDROMORPHONE HCL 1 MG/ML IJ SOLN
1.0000 mg | Freq: Once | INTRAMUSCULAR | Status: AC
  Administered 2016-11-10: 1 mg via INTRAVENOUS
  Filled 2016-11-10: qty 1

## 2016-11-10 MED ORDER — HYDROCODONE-ACETAMINOPHEN 5-325 MG PO TABS: 1.0000 | ORAL_TABLET | Freq: Four times a day (QID) | ORAL | 0 refills | Status: DC | PRN

## 2016-11-10 MED ORDER — LIDOCAINE HCL 2 % IJ SOLN
20.0000 mL | Freq: Once | INTRAMUSCULAR | Status: AC
  Administered 2016-11-10: 400 mg
  Filled 2016-11-10: qty 20

## 2016-11-10 NOTE — ED Notes (Signed)
R shoulder injected by Dr. Wilkie AyeHorton

## 2016-11-10 NOTE — ED Provider Notes (Signed)
[facility]-EMERGENCY DEPT Provider Note   CSN: [id number] Arrival date & time: 11/09/16  2257  By signing my name below, I, Linna Darner, attest that this documentation has been prepared under the direction and in the presence of physician practitioner, Horton, Mayer Masker, MD. Electronically Signed: Linna Darner, Scribe. 11/10/2016. 1:16 AM.  History   Chief Complaint Chief Complaint  Patient presents with  . Shoulder Injury   The history is provided by the patient. No language interpreter was used.   HPI Comments: Roberto Price is a 51 y.o. male who presents to the Emergency Department complaining of a right shoulder injury sustained around 11 PM last night. He suffered a mechanical fall down some steps and landed on his right shoulder. No head trauma or LOC. Patient endorses 10/10 right shoulder pain and has not yet received any pain medications here. He is a non-smoker and drinks alcohol occasionally. NKDA. Patient denies numbness/tingling or any other associated symptoms.  Past Medical History:  Diagnosis Date  . Hypertension   . Right wrist fracture    managed with cast    There are no active problems to display for this patient.   History reviewed. No pertinent surgical history.     Home Medications    Prior to Admission medications   Medication Sig Start Date End Date Taking? Authorizing Provider  HYDROcodone-acetaminophen (NORCO/VICODIN) 5-325 MG tablet Take 1-2 tablets by mouth every 6 (six) hours as needed. 11/10/16   Horton, Mayer Masker, MD  lisinopril-hydrochlorothiazide (PRINZIDE,ZESTORETIC) 20-12.5 MG per tablet Take 1 tablet by mouth daily.    [provider]  UNKNOWN TO PATIENT     [provider]    Family History Family History  Problem Relation Age of Onset  . Diabetes Mother   . Diabetes Father   . Diabetes Sister   . Hypertension Sister   . Diabetes Sister   . Hypertension Sister     Social History Social History    Substance Use Topics  . Smoking status: Never Smoker  . Smokeless tobacco: Never Used  . Alcohol use Yes     Comment: beer occasionally     Allergies   Patient has no known allergies.   Review of Systems Review of Systems  Musculoskeletal: Positive for myalgias.  Skin: Negative for wound.  Neurological: Negative for syncope and numbness.  All other systems reviewed and are negative.  Physical Exam Updated Vital Signs BP (!) 151/104   Pulse 67   Temp 98 F (36.7 C) (Oral)   Resp 16   SpO2 96%   Physical Exam  Constitutional: He is oriented to person, place, and time. He appears well-developed and well-nourished.  Obese  HENT:  Head: Normocephalic and atraumatic.  Cardiovascular: Normal rate and regular rhythm.   Pulmonary/Chest: Effort normal and breath sounds normal. No respiratory distress.  Musculoskeletal: He exhibits no edema.  Deformity and void glenoid on the right shoulder, 2+ radial pulse, neurovascularly intact, no clavicular deformity or humeral deformity  Neurological: He is alert and oriented to person, place, and time.  Skin: Skin is warm and dry.  Psychiatric: He has a normal mood and affect.  Nursing note and vitals reviewed.  ED Treatments / Results  Labs (all labs ordered are listed, but only abnormal results are displayed) Labs Reviewed - No data to display  EKG  EKG Interpretation None       Radiology Dg Shoulder Right  Result Date: 11/09/2016 CLINICAL DATA:  Slipped and fell on steps, with injury  to the right shoulder. Right shoulder swelling and deformity. Initial encounter. EXAM: RIGHT SHOULDER - 2+ VIEW COMPARISON:  None. FINDINGS: There is anterior-inferior dislocation of the right humeral head. No definite osseous Bankart or Hill-Sachs lesion is seen. Degenerative change is noted at the right acromioclavicular joint. Overlying soft tissue swelling is noted. The visualized portions of the right lung are grossly clear. 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 No definite osseous Bankart or Hill-Sachs lesion seen. Electronically Signed   By: Roanna RaiderJeffery  Chang M.D.   On: 11/09/2016 23:32   Dg Shoulder Right Portable  Result Date: 11/10/2016 CLINICAL DATA:  Postreduction EXAM: PORTABLE RIGHT SHOULDER COMPARISON:  11/09/2016 FINDINGS: Anatomic position of the right shoulder postreduction. No acute fractures are identified. No focal bone lesion or bone destruction. IMPRESSION: Anatomic position of the right shoulder postreduction. Electronically Signed   By: Burman NievesWilliam  Stevens M.D.   On: 11/10/2016 02:20    Procedures Reduction of fracture Date/Time: 11/10/2016 2:31 AM Performed by: Shon BatonHORTON, COURTNEY F Authorized by: Shon BatonHORTON, COURTNEY F  Consent: Verbal consent obtained. Risks and benefits: risks, benefits and alternatives were discussed Consent given by: patient Patient understanding: patient states understanding of the procedure being performed Patient identity confirmed: verbally with patient Time out: Immediately prior to procedure a "time out" was called to verify the correct patient, procedure, equipment, support staff and site/side marked as required. Local anesthesia used: yes Anesthesia: local infiltration  Anesthesia: Local anesthesia used: yes Local Anesthetic: lidocaine 2% without epinephrine Anesthetic total: 20 mL  Sedation: Patient sedated: no Patient tolerance: Patient tolerated the procedure well with no immediate complications Comments: Joint injection with lidocaine and pain medication were used.  Reduction was initially attempted with the fares technique. Subsequently axial traction resulted in relocation of the joint. Neurovascularly intact following the procedure. He was placed in a sling. Repeat x-rays reassuring.    (including critical care time)  DIAGNOSTIC STUDIES: Oxygen Saturation is 100% on RA, normal by my interpretation.    COORDINATION OF CARE: 1:15  AM Discussed treatment plan with pt at bedside and pt agreed to plan.  Medications Ordered in ED Medications  lidocaine (XYLOCAINE) 2 % (with pres) injection 400 mg (400 mg Other Given 11/10/16 0130)  HYDROmorphone (DILAUDID) injection 1 mg (1 mg Intravenous Given 11/10/16 0138)     Initial Impression / Assessment and Plan / ED Course  I have reviewed the triage vital signs and the nursing notes.  Pertinent labs & imaging results that were available during my care of the patient were reviewed by me and considered in my medical decision making (see chart for details).     Patient presents with injury to the right shoulder. Dislocation on x-ray. This was reduced at the bedside. Definitive reduction and care were performed by myself.  Patient was placed in a sling. Immobilize 1 week. Range of motion exercise following immobilization. Patient was provided with primary care and orthopedic follow-up if needed.  After history, exam, and medical workup I feel the patient has been appropriately medically screened and is safe for discharge home. Pertinent diagnoses were discussed with the patient. Patient was given return precautions.   Final Clinical Impressions(s) / ED Diagnoses   Final diagnoses:  Dislocation of right shoulder joint, initial encounter    New Prescriptions New Prescriptions   HYDROCODONE-ACETAMINOPHEN (NORCO/VICODIN) 5-325 MG TABLET    Take 1-2 tablets by mouth every 6 (six) hours as needed.   I personally performed the services described in this documentation, which  was scribed in my presence. The recorded information has been reviewed and is accurate.    Shon Baton, MD 11/10/16 [phone]

## 2016-11-10 NOTE — ED Notes (Signed)
Repeat xray completed, shoulder immobilizer placed

## 2016-11-10 NOTE — Discharge Instructions (Signed)
You were seen today after a fall. You had a dislocation of your right shoulder. Keep immobilized for the proximally one week and then start early range of motion exercises. You'll need to follow-up with orthopedics or your primary physician for recheck. You are at risk for repeat dislocation.

## 2016-11-26 ENCOUNTER — Encounter: Payer: Self-pay | Admitting: INTERNAL MEDICINE

## 2016-11-26 NOTE — Progress Notes (Signed)
Internal Medicine Graduating Resident Chart Update  PCP: Janeth Rase, MD    I have met this patient (yes/no): no    Active and pertinent chronic issues:   OSA on CPAP   Asthma, seasonal allergies     Controlled substance agreement (yes/no): No    Needed follow up from new PCP, urgent issues, other recommendations:  Chronic medical issues.     Outstanding health care maintenance:  HIV screen, colonoscopy    Specify time needed for next follow-up appointment: August 2018

## 2017-05-23 ENCOUNTER — Ambulatory Visit: Payer: Commercial Managed Care - PPO | Attending: Internal Medicine

## 2017-05-23 DIAGNOSIS — Z23 Encounter for immunization: Principal | ICD-10-CM | POA: Insufficient documentation

## 2017-05-23 NOTE — Nursing Note (Signed)
The Influenza Vaccine VIS document for the flu injection was given to patient to review. Patient or person named in permission has answered no to any history of egg allergy, previous serious reaction to a influenza vaccine or current illness which would preclude them receiving an immunization. Any questions were referred to the physician. The Influenza Vaccine was then administered per protocol. The patient was observed for immediate reactions to the vaccine per protocol. None were observed.   Ciara-Danielle Satoria Dunlop, LVN

## 2017-05-26 MED FILL — flu vaccine qs 2018-19(6 mos up)(PF)60 mcg(15 mcgx4)/0.5 mL IM syringe: INTRAMUSCULAR | Qty: 0.5 | Status: AC

## 2017-10-03 ENCOUNTER — Encounter: Payer: Self-pay | Admitting: INTERNAL MEDICINE

## 2017-10-03 ENCOUNTER — Encounter: Payer: Self-pay | Admitting: Gastroenterology

## 2017-10-03 DIAGNOSIS — J45909 Unspecified asthma, uncomplicated: Principal | ICD-10-CM

## 2017-10-06 NOTE — Progress Notes (Signed)
Internal Medicine Clinic Note    Name: Alex Werner  MRN: [id number]      Assessment/Plan:  52 y/o man with a history of GERD, OSA on CPAP, asthma, and seasonal allergies who presents to discuss asthma, skin lesions, and right toe pain.     #Asthma  Well controlled. No wheezing on exam today. No history of asthma related hospitalizations or intubations. Triggered by seasaonal allergies, exercise, and cold.   - Albuterol (PROAIR HFA, PROVENTIL HFA, VENTOLIN HFA) 90 mcg/actuation inhaler; Take 2 puffs by inhalation every 4 hours if needed.  Dispense: 8.5 g; Refill: 3  - Fluticasone (FLOVENT HFA) 110 mcg/actuation Inhaler; Take 1 puff by inhalation every 12 hours.  Dispense: 12 g; Refill: 6    #R Toe Pain  Patient  With 6 months of right toe pain (first digit). No changes in range of motion. Patient cannot identify any aggravating or alleviating factors. States that it started with an "injury due to walking." Pain occurs intermittently. No erythema, edema or deformation that would be concerning for gout. Low suspicion for fracture given preserved range of motion. Could be related to arthritic changes. Discussed that patient would benefit from continued conservative management (rest, ice/heat, ibuprofen or tylenol). However, given that symptoms have been persistent for > 6 months, will also order R foot xray for patient to complete if symptoms do not improve.   - FOOT 3+ VIEWS, RIGHT; Future    #L Thigh Lesion  Has been present for the last year and is growing in size. About 1cm in diameter today, dark/melatonic macule with overlying scaling, smooth edges, no variability in color (see media). No areas of open drainage or erythema. However, given growing size of lesion, will refer to dermatology for evaluation.   - Parker  Patient with multiple actinic keratoses lesions on forehead and cheek. Discussed the importance of  sunscreen use. Will refer to dermatology for evaluation of this and L thigh lesion.   - DERMATOLOGY CLINIC REFERRAL    #OSA on CPAP  Patient has a history of OSA and currently uses a CPAP. However, has gained weight and states that he needs a new machine. He does not know the settings on his CPAP and is unsure of when it was last titrated. Will order repeat sleep study and then re-order CPAP pending study results.   - SLEEP STUDY    #Weight Gain  Patient concerned about gaining weight over the last year. States that diet has not changed but that he is not as consistent with his exercise regimen. Previously was exercising daily. Suspect that weight gain might be related to decreased exercise over the last year and we discussed that it would be important for patient to maintain both a healthy diet and try to exercise 30-45 mins, 3x per day. Will revisit this when patient follows up in 6 months.     #Health care maintenance  - GASTROENTEROLOGY REFERRAL  - CBC WITH DIFFERENTIAL; Future  - BASIC METABOLIC PANEL; Future  - LIPID PANEL; Future  - HEMOGLOBIN A1C; Future  - TESTOSTERONE, TOTAL; Future  - TDAP VACCINE IM  (ADACEL)  Vaccinations  Influenza: Completed 05/2017.   Pneumococcal vaccine: Not indicated  Tdap/Td: Due. Completed today 10/07/2017. Due 09/2027.   VZV: Not indicated  Cancer screenings  Colon cancer: Referred today.   Lung cancer: Not indicated  Skin cancer: Recommend SPF 15 for daily sun exposure, and SPF 30 for heavy sun exposure.  Other  HIV: All adults should have HIV screening.   Hepatitis B & C: Patients born between 1945-1965 should receive Hep C screening.       Health Maintenance   Topic Date Due    HIV SCREENING  10/04/1979    Shingrix (Zoster) (1 of 2) 10/03/2016    COLONOSCOPY  10/03/2016    TETANUS (TD)  05/23/2019    Pneumococcal vaccine  Completed    INFLUENZA  Completed       Follow up: 6 months    Harrel Carina, M.D.  PGY-2 Internal Medicine  [facility]  Pager [phone]  PI #:  [id number]        Alex Werner is a 52yr old male in clinic today for evaluation.    CC: medication refill, asthma, skin lesions    HPI:  52 y/o man with a history of GERD, OSA on CPAP, asthma, and seasonal allergies who presents for medication refills. Patient states that he is here for refills of his medications and for annual check up. He states that he is feeling well general but is concerned that he is gaining weight. States that he has not been exercising as regularly as before; eats a normal diet (oatmeal, no fast foods). States that he had a labs done at work including a lipid panel, which he states were all normal. No known history of HTN or DM. Uses over the counter allergy medicatoins which help with his symptoms. States that asthma symptoms are seasonal. He states that Flovent helps control his symptoms well. Uses albuterol 1-2x per day; normally it is once a week. Sometimes asthma is induced by exercise, sometimes by cold. Allergies make his asthma symptoms worse. Has never been intubated or hospitalized for asthma symptoms. No fevers/chlils, cough currently. Notes a history of low testosterone, states that he has low libido. Has received testosterone supplements in the past, but would like to see what his levels are now and discuss what alternative options might be if he does need supplementation.     ROS:  See HPI above for pertinent positives/negatives. All other systems were reviewed and are negative, except as listed above in the HPI.     Allergies:    No Known Allergies    Other-Reaction in Comments    Comment:N/A    Medications:   Outpatient Medications Marked as Taking for the 10/07/17 encounter (Office Visit) with Harrel Carina, MD   Medication Sig Dispense Refill    Albuterol (PROAIR HFA, PROVENTIL HFA, VENTOLIN HFA) 90 mcg/actuation inhaler Take 2 puffs by inhalation every 4 hours if needed. 8.5 g 3    Fluticasone (FLOVENT HFA) 110  mcg/actuation Inhaler Take 1 puff by inhalation every 12 hours. 12 g 6       History: I did review patient's past medical and family/social history, no changes noted.       Physical Exam:  BP 155/87 (SITE: left arm, Orthostatic Position: sitting, Cuff Size: large)   Pulse 72   Temp 36.3 C (97.3 F) (Tympanic)   Ht 1.778 m (5\' 10" )   Wt 102.9 kg (226 lb 13.7 oz)   SpO2 96%   BMI 32.55 kg/m   There were no vitals filed for this visit.    General Appearance: middle aged man, resting comfortably in chair, healthy, alert, NAD  Eyes:  conjunctivae and corneas clear. PERRL, EOM's intact. sclerae normal.  Heart:  normal rate and regular rhythm, no murmurs, clicks, or gallops.  Lungs: clear to auscultation.  No wheezing.   Abdomen: BS normal.  Abdomen soft, non-tender.   Extremities:  no cyanosis, clubbing, or edema. R toe without erythema, edema and with preserved ROM.   Neuro: Gait normal. Sensation and strength grossly normal.  Mental Status: AAO x 3  Skin: L thigh with small (~0.5cm in diameter) melanotic macule with overlying scaling, smooth edges, no variability in color. Bilateral cheeks and forehead with erythematous scaly macules.     Labs/Tests/Imaging:   I reviewed the following laboratory or imaging/study reports, including:   none    I personally reviewed the following images/tracings:  none    Assessment/Plan:  Assessment and plan has been moved to the top of the note for clarity and ease of reading.    EDUCATION:  I educated/instructed the patient or caregiver regarding all aspects of the above stated plan of care.  The patient or caregiver indicated understanding.      [employer] interpreter was not used.

## 2017-10-06 NOTE — Telephone Encounter (Signed)
Provider: Gershon Cull, MD  Last appointment: 10/19/2014  Next appointment: 10/07/17   Last prescribed date per EMR: Drug Name Albuterol Inhaler Date 05/24/16 x18mo  Med last filled: Unknown  Med last discussed in clinic note: 05/23/17, Rx noted under meds at start of encounter  Indication: Asthma d/t Seasonal Allergies  Labs (if required by protocol): N/A     Meets Protocol: No    Pt was seen within 18 months; transition fill provided in previous refill encounter    Per Protocol, approval or denial will be determined by

## 2017-10-06 NOTE — Telephone Encounter (Signed)
Provider: Gershon Cull, MD  Last appointment: 10/19/2014  Next appointment: 10/07/17   Last prescribed date per EMR: Drug Name [place] Date 07/01/15 x93mo  Med last filled: Unknown  Med last discussed in clinic note: 05/23/17, Rx noted under meds at start of encounter  Indication: Asthma d/t Seasonal Allergies  Labs (if required by protocol): N/A     Meets Protocol: No    > 18 months since last appt    Per Protocol, approval or denial will be determined by the provider

## 2017-10-07 ENCOUNTER — Encounter: Payer: Self-pay | Admitting: INTERNAL MEDICINE

## 2017-10-07 ENCOUNTER — Ambulatory Visit: Payer: Commercial Managed Care - PPO | Attending: INTERNAL MEDICINE | Admitting: INTERNAL MEDICINE

## 2017-10-07 VITALS — BP 153/91 | HR 57 | Temp 97.3°F | Ht 70.0 in | Wt 226.9 lb

## 2017-10-07 DIAGNOSIS — L570 Actinic keratosis: Secondary | ICD-10-CM | POA: Insufficient documentation

## 2017-10-07 DIAGNOSIS — G4733 Obstructive sleep apnea (adult) (pediatric): Secondary | ICD-10-CM | POA: Insufficient documentation

## 2017-10-07 DIAGNOSIS — Z0000 Encounter for general adult medical examination without abnormal findings: Secondary | ICD-10-CM | POA: Insufficient documentation

## 2017-10-07 DIAGNOSIS — M79674 Pain in right toe(s): Secondary | ICD-10-CM | POA: Insufficient documentation

## 2017-10-07 DIAGNOSIS — L989 Disorder of the skin and subcutaneous tissue, unspecified: Secondary | ICD-10-CM | POA: Insufficient documentation

## 2017-10-07 DIAGNOSIS — Z9989 Dependence on other enabling machines and devices: Secondary | ICD-10-CM | POA: Insufficient documentation

## 2017-10-07 DIAGNOSIS — R635 Abnormal weight gain: Secondary | ICD-10-CM | POA: Insufficient documentation

## 2017-10-07 DIAGNOSIS — Z23 Encounter for immunization: Secondary | ICD-10-CM | POA: Insufficient documentation

## 2017-10-07 DIAGNOSIS — J45909 Unspecified asthma, uncomplicated: Principal | ICD-10-CM | POA: Insufficient documentation

## 2017-10-07 MED ORDER — ALBUTEROL SULFATE HFA 90 MCG/ACTUATION AEROSOL INHALER: 2.0000 | INHALATION_SPRAY | RESPIRATORY_TRACT | 3 refills | Status: DC | PRN

## 2017-10-07 MED ORDER — FLUTICASONE 110 MCG/ACTUATION AEROSOL ORAL INHALER
1.0000 | INHALATION_SPRAY | Freq: Two times a day (BID) | RESPIRATORY_TRACT | 6 refills | Status: DC
Start: 2017-10-07 — End: 2018-04-28

## 2017-10-07 NOTE — Nursing Note (Signed)
Immunization VIS documentation(s) were given to Alex Werner to review. All questions were answered and the patient consented to the Immunization(s) being given. Patient allergies were reviewed and no contraindications were found. The immunization(s) were given as ordered. The patient was observed for any immediate reactions to the vaccine. None were observed.    Renold Don, RN

## 2017-10-07 NOTE — Progress Notes (Signed)
I saw and evaluated the patient with Dr. Singh. Discussed and agree with the findings and plan we developed as documented in the resident's note.    Matasha Smigelski, MD  [facility]   Pager#: [phone]  PI#: [id number]

## 2017-10-07 NOTE — Nursing Note (Signed)
Vital signs taken, allergies verified, screened for pain, tobacco hx verified.    Onofrio Klemp, MA I

## 2017-10-08 MED ORDER — ALBUTEROL SULFATE HFA 90 MCG/ACTUATION AEROSOL INHALER: 2.0000 | INHALATION_SPRAY | RESPIRATORY_TRACT | 2 refills | Status: AC | PRN

## 2017-10-08 MED ORDER — FLUTICASONE 110 MCG/ACTUATION AEROSOL ORAL INHALER
1.0000 | INHALATION_SPRAY | Freq: Two times a day (BID) | RESPIRATORY_TRACT | 6 refills | Status: DC
Start: 2017-10-08 — End: 2018-04-28

## 2017-10-08 MED FILL — diphth,pertus(acel)tetanus(PF)2Lf-(2.5-5-3-5mcg)-5 Lf/0.5 mL IM susp: INTRAMUSCULAR | Qty: 0.5 | Status: AC

## 2017-10-16 ENCOUNTER — Other Ambulatory Visit: Payer: Self-pay | Admitting: Gastroenterology

## 2017-10-16 MED ORDER — PEG 3350-ELECTROLYTES 236 GRAM-22.74 GRAM-6.74 GRAM-5.86 GRAM SOLUTION
4.0000 L | Freq: Once | ORAL | 0 refills | Status: AC
Start: 2017-10-16 — End: 2018-01-14

## 2017-11-06 ENCOUNTER — Encounter: Payer: Self-pay | Admitting: INTERNAL MEDICINE

## 2017-11-06 DIAGNOSIS — G4733 Obstructive sleep apnea (adult) (pediatric): Principal | ICD-10-CM

## 2017-11-06 NOTE — Telephone Encounter (Signed)
From: Dara Lords  Sent: 11/05/2017 10:56 PM PDT  Subject: Referral Question    I completed the Sleep Study last night, 5/21. When you receive the results I believe I will need a prescription for a new CPAP - my current unit is 51 years old.

## 2017-11-12 ENCOUNTER — Telehealth: Payer: Self-pay | Admitting: Gastroenterology

## 2017-11-12 NOTE — Telephone Encounter (Signed)
Pre procedure follow up call : Left message on patient's voicemail  regarding upcoming procedure appointment. Instructed to call with any questions or concerns the [facility] M-F between 8am -4:30 pm.    Lilah Mijangos Senior LVN  [facility]  [phone]

## 2017-11-13 ENCOUNTER — Encounter: Payer: Self-pay | Admitting: Gastroenterology

## 2017-11-13 NOTE — H&P (Signed)
Gastroenterology/Hepatology Pre-procedure History & Physical   IMMEDIATE PRE-SEDATION ASSESSMENT    Referring provider: Gershon Cull, MD  Procedure: colonoscopy    Attending: Luanna Salk, MD  Assistant Surgeon: None     SUBJECTIVE  HPI: Alex Werner is a 52yr old male w/ history of asthma, OSA who presents for colon cancer screening. Currently, feeling well, no acute symptoms.     No blood thinners. Denies family history of colon cancer.     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     Past Surgical History:   Procedure Laterality Date    VASECTOMY, UNILAT/BILAT, W/POSTOP SEMEN EXAM  12/06/05    Vasectomy       Social History     Socioeconomic History    Marital status: MARRIED     Spouse name: Not on file    Number of children: 2    Years of education: Not on file    Highest education level: Not on file   Occupational History    Not on file   Social Needs    Financial resource strain: Not on file    Food insecurity:     Worry: Not on file     Inability: Not on file    Transportation needs:     Medical: Not on file     Non-medical: Not on file   Tobacco Use    Smoking status: Never Smoker    Smokeless tobacco: Never Used   Substance and Sexual Activity    Alcohol use: Yes     Alcohol/week: 1.0 oz    Drug use: No    Sexual activity: Yes     Partners: Female   Lifestyle    Physical activity:     Days per week: Not on file     Minutes per session: Not on file    Stress: Not on file   Relationships    Social connections:     Talks on phone: Not on file     Gets together: Not on file     Attends religious service: Not on  file     Active member of club or organization: Not on file     Attends meetings of clubs or organizations: Not on file     Relationship status: Not on file    Intimate partner violence:     Fear of current or ex partner: Not on file     Emotionally abused: Not on file     Physically abused: Not on file     Forced sexual activity: Not on file   Other Topics Concern    Not on file   Social History Narrative     pmh - asthma , rectal bleeding, infertility ( couple ) ,rt shoulder pain , low testosterone ,gerd     rosacea , lumbago       surgery - vasectomy       meds-  see med list  allergies none       habits- non smoking , occ etoh , not exercising       sh- married  2 childern ,       fam hx - father with asthma , mom has irregular heart beats,       Review of Systems  10 point ROS was obtained and is negative except as mentioned in the HPI.     I did review all available medical, surgical, personal/social history.    OBJECTIVE    Physical Exam:   General Appearance: No acute distress  Eyes: conjunctivae and corneas sclerae normal.  Mouth: mucous membranes moist   Neck: Neck supple.  Lungs: Breathing without difficult or use of accessory muscles  Abdomen: BS normal.  Abdomen soft, non-tender.  No masses or organomegaly.   Extremities: warm, no pitting edema, 2+ radial pulses   Skin: no rashes.      Labs/Imaging:   Lab Results   Lab Name Value Date/Time    WBC 5.7 04/15/2012 09:05 AM    HGB 14.8 04/15/2012 09:05 AM    HCT 44.1 04/15/2012 09:05 AM    PLT 168 04/15/2012 09:05 AM       Performed at [facility], reviewed:  See computer database.  Performed outside of [place] (reviewed if applicable):  N/A    ASSESSMENT / PLAN:  colonoscopy    Per patient, may discuss bx/procedure results with self and/or leave message on voicemail at home.    Pre-Sedation/Anesthesia Assessment:    Patient is a candidate for moderate sedation.   Patient is ASA status: 1 - Normal healthy patient    Airway Assessment:    Normal, Mallampati class  2 and Mouth opening: normal  ROM normal      The procedure, risks, benefits, and alternatives were explained.  All patient questions were answered.  The informed consent was signed.    Patient barriers to learning: None     Patient/family understanding: verbalizes     Luanna Salk, MD

## 2017-11-14 ENCOUNTER — Encounter: Payer: Self-pay | Admitting: Gastroenterology

## 2017-11-14 ENCOUNTER — Ambulatory Visit
Admission: RE | Admit: 2017-11-14 | Discharge: 2017-11-14 | Disposition: A | Discharge status: Home / Self Care | Payer: Commercial Managed Care - PPO | Source: Ambulatory Visit | Attending: Gastroenterology | Admitting: Gastroenterology

## 2017-11-14 VITALS — BP 141/71 | HR 71 | Temp 97.9°F | Resp 12 | Ht 70.0 in | Wt 220.0 lb

## 2017-11-14 DIAGNOSIS — D125 Benign neoplasm of sigmoid colon: Secondary | ICD-10-CM | POA: Insufficient documentation

## 2017-11-14 DIAGNOSIS — G4733 Obstructive sleep apnea (adult) (pediatric): Secondary | ICD-10-CM | POA: Insufficient documentation

## 2017-11-14 DIAGNOSIS — Z86010 Personal history of colonic polyps: Secondary | ICD-10-CM | POA: Insufficient documentation

## 2017-11-14 DIAGNOSIS — K635 Polyp of colon: Secondary | ICD-10-CM | POA: Insufficient documentation

## 2017-11-14 DIAGNOSIS — Z9989 Dependence on other enabling machines and devices: Secondary | ICD-10-CM | POA: Insufficient documentation

## 2017-11-14 DIAGNOSIS — Z1211 Encounter for screening for malignant neoplasm of colon: Principal | ICD-10-CM | POA: Insufficient documentation

## 2017-11-14 HISTORY — DX: Unspecified asthma, uncomplicated: J45.909

## 2017-11-14 HISTORY — PX: PR COLSC FLX W/RMVL OF TUMOR POLYP LESION SNARE TQ: 45385

## 2017-11-14 MED ORDER — EPINEPHRINE 0.1 MG/ML INJECTION SYRINGE
0.3000 mg | INJECTION | INTRAMUSCULAR | Status: DC | PRN
Start: 2017-11-14 — End: 2017-11-20

## 2017-11-14 MED ORDER — DIPHENHYDRAMINE 50 MG/ML INJECTION SOLUTION
12.5000 mg | INTRAMUSCULAR | Status: DC | PRN
Start: 2017-11-14 — End: 2017-11-20
  Administered 2017-11-14: 25 mg via INTRAVENOUS
  Filled 2017-11-14: qty 1

## 2017-11-14 MED ORDER — LACTATED RINGERS IV INFUSION
INTRAVENOUS | Status: DC
Start: 2017-11-14 — End: 2017-11-20

## 2017-11-14 MED ORDER — NALOXONE 0.4 MG/ML INJECTION SOLUTION
0.4000 mg | INTRAMUSCULAR | Status: DC | PRN
Start: 2017-11-14 — End: 2017-11-20

## 2017-11-14 MED ORDER — EPINEPHRINE 0.1 MG/ML INJECTION SYRINGE
1.0000 mg | INJECTION | INTRAMUSCULAR | Status: DC | PRN
Start: 2017-11-14 — End: 2017-11-20

## 2017-11-14 MED ORDER — ATROPINE 0.1 MG/ML INJECTION SYRINGE
0.5000 mg | INJECTION | INTRAMUSCULAR | Status: DC | PRN
Start: 2017-11-14 — End: 2017-11-20

## 2017-11-14 MED ORDER — FLUMAZENIL 0.1 MG/ML INTRAVENOUS SOLUTION
0.2000 mg | INTRAVENOUS | Status: DC | PRN
Start: 2017-11-14 — End: 2017-11-20

## 2017-11-14 MED ORDER — MIDAZOLAM (PF) 1 MG/ML INJECTION SOLUTION
0.5000 mg | INTRAMUSCULAR | Status: DC | PRN
Start: 2017-11-14 — End: 2017-11-20
  Administered 2017-11-14: 5 mg via INTRAVENOUS
  Filled 2017-11-14: qty 10

## 2017-11-14 MED ORDER — SIMETHICONE 40 MG/0.6 ML ORAL DROPS,SUSPENSION
40.0000 mg | Freq: Once | ORAL | Status: AC
Start: 2017-11-14 — End: 2017-11-14
  Administered 2017-11-14: 40 mg
  Filled 2017-11-14: qty 0.6

## 2017-11-14 MED ORDER — FENTANYL (PF) 50 MCG/ML INJECTION SOLUTION
12.5000 ug | INTRAMUSCULAR | Status: DC | PRN
Start: 2017-11-14 — End: 2017-11-20
  Administered 2017-11-14: 100 ug via INTRAVENOUS
  Filled 2017-11-14: qty 4

## 2017-11-14 NOTE — Discharge Instructions (Signed)
Discharge Instructions for colonoscopy    Findings:  colonoscopy   - 2 polyps removed    Activity:    Rest today, resume usual activitiy tomorrow     Diet:    Resume usual diet     Medication:    Resume all usual medications    Follow up:   We will notify you of your biopsy results via phone call or letter.If you do not receive notice of your results by three weeks, please call us at the phone number provided below.      During your procedure, air was pumped into your GI tract so your doctor could see clearly to make a diagnosis and/or treat your problem.     Some possible side effects you may experience are:   - Discomfort due to a distended (bloated) abdomen which will subside after a few hours to two days.   - Nausea may be a side effect of the medication and will subside.   - The medications you received may make you dizzy and sleepy, it is important that you do not drive, operate machinery or drink alcohol for at least one day.   - Severe pain is not expected and should be reported    Other side effects may include:  Flex. Sig. Or Colonoscopy: A small amount of diarrhea may follow the exam.    If problems, call [facility] ([phone]) during business hours Monday through Friday 7:30am to 4:30 pm.  After hours call [phone] and ask to speak to the GI Fellow on call.       Colon Polyps    A polyp is extra tissue that grows inside your body. Colon polyps grow in the large intestine.  The large intestine, also called the colon, is part of your digestive system. It's a long, hollow tube at the end of your digestive tract where your body makes and stores stool.    Are polyps dangerous?    Most polyps are not dangerous. Most are benign which means they are not cancer. But over time, some types of polyps can turn into cancer. Usually, polyps that are smaller than a pea aren't harmful. But larger polyps could someday become cancer or may already be cancer. There are two common types of polyps, hyperplastic  and adenomatous polyps.  Hyperplastic polyps generally remain small and are not precancerous.  Adenomatous polyps or adenomas, have the potential to grow large and become cancerous.  To be safe, doctors remove all polyps and test them to determine what type they are.    Who gets polyps?    Anyone can get polyps, but certain people are more likely than others. You may have a greater chance of getting polyps if you're over 50, you've had polyps before, someone in your family has had polyps, someone in your family has had cancer of the large intestine.   You may also be more likely to get polyps if you eat a lot of fatty foods, smoke, drink alcohol, don't exercise, or weigh too much.    What are the symptoms?    Most small polyps don't cause symptoms. Often, people don't know they have one until the doctor finds it during a regular checkup or while testing them for something else.  Some people do have symptoms including bleeding from the anus, constipation or diarrhea that lasts more than a week, blood in the stool. Blood can make stool look black, or it can show up as red streaks   the stool.  If you have any of these symptoms, see a doctor to find out what the problem is.    How does the doctor test for polyps?    The doctor can use four tests to check for polyps:    Digital rectal exam. The doctor wears gloves and checks your rectum, the last part of the large intestine, to see if it feels normal. This test would find polyps only in the rectum, so the doctor may need to do one of the other tests listed below to find polyps higher up in the intestine.    Barium enema. The doctor puts a liquid called barium into your rectum before taking x rays of your large intestine. Barium makes your intestine look white in the pictures. Polyps are dark, so they're easy to see.    Sigmoidoscopy:  With this test, the doctor can see inside your large intestine. The doctor puts a thin flexible tube into your rectum. The device is  called a sigmoidoscope, and it has a light and a tiny video camera in it. The doctor uses the sigmoidoscope to look at the last third of your large intestine.    Colonoscopy:  This test is like sigmoidoscopy, but the doctor looks at all of the large intestine. It usually requires sedation.    Who should get tested for polyps?    Talk to your doctor about getting tested for polyps if you have symptoms, you're 55 years old or older, someone in your family has had polyps or colon cancer.    How are polyps treated?    The doctor will remove the polyp. Sometimes, the doctor takes it out during sigmoidoscopy or colonoscopy. Or the doctor may decide to operate through the abdomen.  The polyp is then tested for cancer.  If you've had polyps, the doctor may want you to get tested regularly in the future.    How can I prevent polyps?    Doctors don't know of any one sure way to prevent polyps. But you might be able to lower your risk of getting them if you eat more fruits and vegetables and less fatty food, don't smoke, avoid alcohol, exercise every day, lose weight if you're overweight.      Eating more calcium and folate can also lower your risk of getting polyps. Some foods that are rich in calcium are milk, cheese, and broccoli. Some foods that are rich in folate are chickpeas, kidney beans, and spinach.  Some doctors think that aspirin might help prevent polyps. Studies are under way.     Points to Remember   A polyp is extra tissue that grows inside the body. Most polyps are not harmful.   Symptoms may include constipation or diarrhea for more than a week or blood                 on your underwear, on toilet paper, or in your stool.   Many polyps do not cause symptoms.   Doctors remove all polyps and test them for cancer.   Talk to your doctor about getting tested for polyps if              o you have any symptoms              o you're 87 years old or older              o someone in your family has had polyps or  colon cancer

## 2017-11-14 NOTE — Nurse Focus (Signed)
NURSING PRE-SEDATION ASSESSMENT    Alex Werner arrived by ambulating into clinic today at  from home.      Patient has arranged for a ride home from [place], wife who can be contacted at [phone]. Instructed patient that post sedation, they are not to drive or drink alcohol, sign any important/legal documents or make any important decisions for the remainder of the day and patient verbalized understanding.      Identification band placed on and two forms of identification information verified: yes    Verified procedure with the patient. yes    NPO since: 1100    Pt completed bowel prep: Yes      Past Surgical History:   Procedure Laterality Date    VASECTOMY, UNILAT/BILAT, W/POSTOP SEMEN EXAM  12/06/05    Vasectomy       Social History     Tobacco Use    Smoking status: Never Smoker    Smokeless tobacco: Never Used   Substance Use Topics    Alcohol use: Yes     Alcohol/week: 1.0 oz       Social History     Substance and Sexual Activity   Drug Use No        Past anesthesia responses:  No problem.      Current Outpatient Medications   Medication Sig    Albuterol (PROAIR HFA, PROVENTIL HFA, VENTOLIN HFA) 90 mcg/actuation inhaler Take 2 puffs by inhalation every 4 hours if needed.    Albuterol (PROAIR HFA, PROVENTIL HFA, VENTOLIN HFA) 90 mcg/actuation inhaler Take 2 puffs by inhalation every 4 hours if needed.    Fluticasone (FLOVENT HFA) 110 mcg/actuation Inhaler Take 1 puff by inhalation every 12 hours.    Fluticasone (FLOVENT HFA) 110 mcg/actuation Inhaler Take 1 puff by inhalation every 12 hours.    PEG-Electrolyte Soln (COLYTE, GOLYTELY) 236-22.74-6.74 -5.86 gram Liquid Take 4,000 mL by mouth one time. Follow [facility] prep instructions     Current Facility-Administered Medications   Medication    Atropine Injection 0.5 mg    DiphenhydrAMINE (BENADRYL) Injection 12.5-25 mg    Epinephrine (ADRENALIN) 0.1 mg/mL Injection 0.3-1 mg    Epinephrine (ADRENALIN) 0.1 mg/mL Injection 0.3-1 mg     Epinephrine (ADRENALIN) 0.1 mg/mL Injection 1 mg    Fentanyl (SUBLIMAZE) Injection 12.5-75 mcg    Flumazenil (ROMAZICON) Injection 0.2 mg    Lactated Ringers Infusion    Midazolam (VERSED) Injection 0.5-3 mg    Naloxone (NARCAN) Injection 0.4 mg    Simethicone (MYLICON) 40 TD/1.7 mL Oral Suspension 40-80 mg       Anticoagulants: Patient denies taking Aspirin, NSAIDs, or other anticoagulants for the past 5 days.    OTC/Herbal preparations: none    Reviewed health status with the patient in regards to allergies (including latex), medications, pregnancy, hypertension, atrial fibrilation, liver disease, bleeding disorders, diabetes,  CVA's, seizures, sleep apnea, glaucoma, cancer, prosthesis or implants, infectious diseases, surgical history; and disease of heart, lungs, liver, or kidneys. Positive history reviewed and updated in Health History section of the EMR.    Patient Active Problem List   Diagnosis    Esophageal reflux    Other testicular hypofunction    Asthma due to seasonal allergies    Lumbago    Sleep apnea on CPAP    Fatigue    Depression    Anxiety       Allergies   Allergen Reactions    No Known Allergies Other-Reaction in Comments  N/A       Past Medical History:   Diagnosis Date    ASTHMA UNSPECIFIED 03/26/2003    ESOPHAGEAL REFLUX 03/23/2003    TESTICULAR HYPOFUNC NEC 03/23/2003       Nursing Assessment Data Base:  Ventilation/Respirations: normal, unlabored.  Circulation/Perfusion/Skin: warm,normal color,dry.  Cognition/Communication -- Behavior: alert and oriented,cooperative.  Gastro-Intestinal: no distress.  Abdomen: soft,non-tender.  Level of Ambulation: self.    Belongings are with patient in garment bag under gurney.  Patient has: Glasses for reading only.  Barriers to Learning assessed: none. Patient verbalizes understanding of teaching and instructions.    Nursing assessment completed by:  Danley Danker, RN  11/14/2017 14:12

## 2017-11-14 NOTE — Procedures (Addendum)
PROCEDURE NOTE    Attestation:  ID verified by two sources: DOB and Name  Was this an emergency procedure?  no     I attest that I verified the following information prior to performing the procedure: Patient ID, Site and Procedure     Patient Name:  Alex Werner  Location: [facility]  MRN: [id number]   DOB: [birth year]  DOS: 11/14/2017  Age: 20yr  Sex: male       Procedure: Colonoscopy  Subprocedure: Cold Snare Polypectomy       Referring Physician: PCP: Gershon Cull, MD    PERFORMING SURGEON: Luanna Salk, MD  ASSISTANT SURGEON: None    Indication:   average-risk colon cancer screening    Sedation:  Versed 5 mg IV, Fentanyl 100 mcg IV and Benadryl 25 mg IV  Sedation was administered for single procedure/procedures    I was the responsible provider supervising the administration of moderate sedation to this patient by a trained independent provider (RN). I was present during the interservice time from initial administration of sedatives until the end of the procedure.    Sedation Start: 1625  Time Start:  5003     Cecal Time: 7048   Time end: 1719    Details of the Procedure:    Informed consent was obtained for the procedure, including sedation.  Risks of perforation, hemorrhage, adverse drug reaction, pain, infection, aspiration, missed lesion or diagnosis, and cardiopulmonary arrest were discussed.  The patient was placed in the left lateral decubitus position. The patient was monitored continuously with EKG tracing, pulse oximetry, blood pressure monitoring, and direct observations.      A rectal examination was performed.  The Olympus Adult colonoscope was inserted into the rectum and advanced under direct vision to the terminal ileum.  The quality of the colonic preparation was scored as Boston Bowel Preparation Score of 2+2+3. As the endoscope was withdrawn care was taken to expose and inspect the proximal sides of the IC valve, the haustral folds, flexures, and rectal valves. The ascending colon  was evaluated twice. Retroflexed view of the rectum was done. Findings and interventions are described below.  Photo documentation was obtained.  The patient tolerated the procedure well, and there were not complications.  He was taken to the recovery area in stable condition.         Findings and Interventions:    1) Rectal Exam: Digital rectal exam did not reveal any masses or strictures.    2) Retroflexion: Normal.    3) Terminal ileum was normal for the examined extent.    4) In the sigmoid colon, there were two 5 mm sessile polyps (Paris 0-Is) which were completely resected by cold snare polypectomy and retrieved.     5) The exam was otherwise normal.    Impression:  1) Two polyps resected.          Recommendation/Plan:  1) Await pathology           Report Electronically Signed By: Luanna Salk, MD Gastroenterology Attending    A.        COLON, SIGMOID, polypectomy x 2 (36mm) (BIOPSY):  -   Tubular adenoma (1).  -   Sessile serrated adenoma (1).    I would recommend that colonoscopy be repeated in 5 years.

## 2017-11-16 NOTE — Telephone Encounter (Signed)
Ordered a new CPAP machine based on sleep study dated 5.31.2019. Patient will need appointment within 2 months after receiving machine. Will forward to phone room to schedule f/u    Seabron Spates, M.D.  Internal Medicine, PGY-2  Pager: [phone]  PI: [phone]

## 2017-11-16 NOTE — Addendum Note (Signed)
Addended by: Seabron Spates on: 11/16/2017 05:15 PM     Modules accepted: Orders

## 2017-11-17 NOTE — Telephone Encounter (Signed)
Dr. Verneita Griffes,    Patient placed on recall list to be scheduled within 2 months time.    Thank you,    Worthy Keeler II   Internal [facility]

## 2017-11-19 ENCOUNTER — Encounter: Payer: Self-pay | Admitting: Gastroenterology

## 2017-11-19 LAB — SURGICAL PATHOLOGY

## 2018-01-08 ENCOUNTER — Ambulatory Visit: Payer: Commercial Managed Care - PPO | Admitting: INTERNAL MEDICINE

## 2018-01-15 ENCOUNTER — Telehealth: Payer: Self-pay | Admitting: INTERNAL MEDICINE

## 2018-01-15 NOTE — Telephone Encounter (Signed)
Paperwork from [facility] was received and placed in [employer] box on 01/15/18 for MD to complete.    Thank You    Noelie Renfrow Cleotis Nipper MA II

## 2018-01-20 NOTE — Telephone Encounter (Signed)
Brief [employer] DOW:    Received paperwork from [facility] regarding CPAP compliance report. Per review, patient appears to be meeting goals of therapy. Will place copy in PCP box for review and place in EMR box for upload to chart.     Harrel Carina, M.D.  PGY-3 Internal Medicine  [facility]  Pager [phone]  PI #: [phone]

## 2018-04-23 ENCOUNTER — Ambulatory Visit (HOSPITAL_COMMUNITY)
Admission: EM | Admit: 2018-04-23 | Discharge: 2018-04-23 | Disposition: A | Discharge status: Home / Self Care | Payer: BLUE CROSS/BLUE SHIELD | Attending: Family Medicine | Admitting: Family Medicine

## 2018-04-23 ENCOUNTER — Ambulatory Visit (INDEPENDENT_AMBULATORY_CARE_PROVIDER_SITE_OTHER): Payer: BLUE CROSS/BLUE SHIELD

## 2018-04-23 ENCOUNTER — Encounter (HOSPITAL_COMMUNITY): Payer: Self-pay | Admitting: Emergency Medicine

## 2018-04-23 DIAGNOSIS — S93602A Unspecified sprain of left foot, initial encounter: Secondary | ICD-10-CM

## 2018-04-23 MED ORDER — IBUPROFEN 600 MG PO TABS: 600.0000 mg | ORAL_TABLET | Freq: Four times a day (QID) | ORAL | 0 refills | Status: AC | PRN

## 2018-04-23 NOTE — ED Triage Notes (Signed)
Per pt he stepped on a rock and twisted his left ankle.

## 2018-04-23 NOTE — Discharge Instructions (Signed)
Use anti-inflammatories for pain/swelling. You may take up to 800 mg Ibuprofen every 8 hours with food. You may supplement Ibuprofen with Tylenol [phone] mg every 8 hours.  Ice and elevate foot Ace wraps for compression and support Slowly transition to weightbearing as pain improving Please follow up with ortho if symptoms persisting

## 2018-04-23 NOTE — ED Provider Notes (Signed)
[facility]    CSN: [id number] Arrival date & time: 04/23/18  1043     History   Chief Complaint Chief Complaint  Patient presents with  . Foot Pain    HPI Oriel Rumbold is a 52 y.o. male history of hypertension presenting today for evaluation of left foot injury.  Patient states that on Sunday, approximately 4 days ago he stepped on a rock and twisted his ankle.  Denies falling.  Since he has had increased pain and swelling.  Pain with weightbearing.  Denies numbness or tingling.  Endorses most of his pain along the lateral surface of his foot and the bottom.  HPI  Past Medical History:  Diagnosis Date  . Hypertension   . Right wrist fracture    managed with cast    There are no active problems to display for this patient.   History reviewed. No pertinent surgical history.     Home Medications    Prior to Admission medications   Medication Sig Start Date End Date Taking? Authorizing Provider  HYDROcodone-acetaminophen (NORCO/VICODIN) 5-325 MG tablet Take 1-2 tablets by mouth every 6 (six) hours as needed. 11/10/16   Horton, Mayer Masker, MD  ibuprofen (ADVIL,MOTRIN) 600 MG tablet Take 1 tablet (600 mg total) by mouth every 6 (six) hours as needed. 04/23/18   Shiv Shuey C, PA-C  lisinopril-hydrochlorothiazide (PRINZIDE,ZESTORETIC) 20-12.5 MG per tablet Take 1 tablet by mouth daily.    [provider]  UNKNOWN TO PATIENT     [provider]    Family History Family History  Problem Relation Age of Onset  . Diabetes Mother   . Diabetes Father   . Diabetes Sister   . Hypertension Sister   . Diabetes Sister   . Hypertension Sister     Social History Social History   Tobacco Use  . Smoking status: Never Smoker  . Smokeless tobacco: Never Used  Substance Use Topics  . Alcohol use: Yes    Comment: beer occasionally  . Drug use: No     Allergies   Patient has no known allergies.   Review of Systems Review of Systems   Constitutional: Negative for fatigue and fever.  Eyes: Negative for redness, itching and visual disturbance.  Respiratory: Negative for shortness of breath.   Cardiovascular: Negative for chest pain and leg swelling.  Gastrointestinal: Negative for nausea and vomiting.  Musculoskeletal: Positive for arthralgias, gait problem, joint swelling and myalgias.  Skin: Negative for color change, rash and wound.  Neurological: Negative for dizziness, syncope, weakness, light-headedness and headaches.     Physical Exam Triage Vital Signs ED Triage Vitals  Enc Vitals Group     BP 04/23/18 1140 (!) 157/97     Pulse Rate 04/23/18 1140 76     Resp --      Temp 04/23/18 1140 98.6 F (37 C)     Temp Source 04/23/18 1140 Oral     SpO2 04/23/18 1140 100 %     Weight --      Height --      Head Circumference --      Peak Flow --      Pain Score 04/23/18 1141 10     Pain Loc --      Pain Edu? --      Excl. in GC? --    No data found.  Updated Vital Signs BP (!) 157/97 (BP Location: Right Arm)   Pulse 76   Temp 98.6 F (37 C) (  Oral)   SpO2 100%   Visual Acuity Right Eye Distance:   Left Eye Distance:   Bilateral Distance:    Right Eye Near:   Left Eye Near:    Bilateral Near:     Physical Exam  Constitutional: He is oriented to person, place, and time. He appears well-developed and well-nourished.  No acute distress  HENT:  Head: Normocephalic and atraumatic.  Nose: Nose normal.  Eyes: Conjunctivae are normal.  Neck: Neck supple.  Cardiovascular: Normal rate.  Pulmonary/Chest: Effort normal. No respiratory distress.  Abdominal: He exhibits no distension.  Musculoskeletal: Normal range of motion.  Patient with moderate swelling over lateral malleolus, no overlying erythema, nontender to palpation of lateral and medial malleolus, mild tenderness to base of Achilles tendon, negative Thompson's.  Tenderness to palpation along lateral fifth metatarsal on dorsum surface as well as  on plantar surface of foot.  Nontender over first through fifth metatarsal  Dorsalis pedis 2+, cap refill less than 2 seconds  Neurological: He is alert and oriented to person, place, and time.  Skin: Skin is warm and dry.  Psychiatric: He has a normal mood and affect.  Nursing note and vitals reviewed.    UC Treatments / Results  Labs (all labs ordered are listed, but only abnormal results are displayed) Labs Reviewed - No data to display  EKG None  Radiology Dg Ankle Complete Left  Result Date: 04/23/2018 CLINICAL DATA:  Left ankle injury.  Injured 5 days ago. EXAM: LEFT ANKLE COMPLETE - 3+ VIEW COMPARISON:  None. FINDINGS: There is no evidence of fracture, dislocation, or joint effusion. There is a possible small osteochondral lesion involving the medial corner of the talar dome. There is a small plantar calcaneal spur. 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 Soft tissue swelling around the left ankle and lower leg. IMPRESSION: No acute osseous injury of the left ankle. Electronically Signed   By: Elige Ko   On: 04/23/2018 12:08   Dg Foot Complete Left  Result Date: 04/23/2018 CLINICAL DATA:  Left foot pain after fall 5 days ago, lateral sided. EXAM: LEFT FOOT - COMPLETE 3+ VIEW COMPARISON:  None. FINDINGS: Nonspecific soft tissue swelling. No acute fracture or dislocation. Degenerative midfoot spurring. Spurring about the ankle, midfoot, and plantar heel. IMPRESSION: Soft tissue swelling without acute osseous finding. Electronically Signed   By: Marnee Spring M.D.   On: 04/23/2018 12:38    Procedures Procedures (including critical care time)  Medications Ordered in UC Medications - No data to display  Initial Impression / Assessment and Plan / UC Course  I have reviewed the triage vital signs and the nursing notes.  Pertinent labs & imaging results that were available during my care of the patient were reviewed by me and considered in my medical decision  making (see chart for details).     Ankle x-ray ordered in triage given swelling, but after exam recommended foot x-ray as well given location of tenderness.  Ankle x-ray showing possible OCD to talar dome, no fracture.  Obtain separate foot x-ray given tenderness along fifth MTP.  This was also negative.  Will treat as sprain, Ace wrap, crutches provided given patient having difficulty with weightbearing, slowly transition to weight-bear as tolerated.  Anti-inflammatories.  Discussed possible OCD with patient, he has minimal tenderness in this area, and to follow-up with orthopedics if symptoms persisting.Discussed strict return precautions. Patient verbalized understanding and is agreeable with plan.  Final Clinical Impressions(s) / UC Diagnoses  Final diagnoses:  Sprain of left foot, initial encounter     Discharge Instructions     Use anti-inflammatories for pain/swelling. You may take up to 800 mg Ibuprofen every 8 hours with food. You may supplement Ibuprofen with Tylenol [phone] mg every 8 hours.  Ice and elevate foot Ace wraps for compression and support Slowly transition to weightbearing as pain improving Please follow up with ortho if symptoms persisting    ED Prescriptions    Medication Sig Dispense Auth. Provider   ibuprofen (ADVIL,MOTRIN) 600 MG tablet Take 1 tablet (600 mg total) by mouth every 6 (six) hours as needed. 30 tablet Verley Pariseau, [place] C, PA-C     Controlled Substance Prescriptions [place] Controlled Substance Registry consulted? Not Applicable   Lew Dawes, [place] 04/23/18 1541

## 2018-04-28 ENCOUNTER — Other Ambulatory Visit: Payer: Self-pay | Admitting: INTERNAL MEDICINE

## 2018-04-28 DIAGNOSIS — J45909 Unspecified asthma, uncomplicated: Principal | ICD-10-CM

## 2018-04-28 MED ORDER — FLUTICASONE 110 MCG/ACTUATION AEROSOL ORAL INHALER
1.0000 | INHALATION_SPRAY | Freq: Two times a day (BID) | RESPIRATORY_TRACT | 1 refills | Status: DC
Start: 2018-04-28 — End: 2019-08-30

## 2018-04-28 NOTE — Telephone Encounter (Signed)
Refill authorized per P&T Protocol 04/28/2018

## 2018-04-28 NOTE — Telephone Encounter (Signed)
Provider: Gershon Cull, MD  Last appointment: 10/07/17  Next appointment: n/a   Last prescribed date per EMR: Drug Name flovent HFA Date 10/08/17 x60mo  Med last filled: unknown  Med last discussed in clinic note: 10/07/17  Indication: asthma  Labs (if required by protocol): n/a   Meets Protocol: Yes: Refill request has been pended to the pharmacist for review per P&T Protocol 04/28/2018

## 2018-06-19 ENCOUNTER — Other Ambulatory Visit: Payer: Self-pay | Admitting: INTERNAL MEDICINE

## 2018-06-19 DIAGNOSIS — J45909 Unspecified asthma, uncomplicated: Principal | ICD-10-CM

## 2018-06-19 MED ORDER — ALBUTEROL SULFATE HFA 90 MCG/ACTUATION AEROSOL INHALER: 2.0000 | INHALATION_SPRAY | RESPIRATORY_TRACT | 2 refills | Status: DC | PRN

## 2018-06-19 NOTE — Telephone Encounter (Signed)
Refill authorized per P&T Protocol 06/19/2018

## 2018-06-19 NOTE — Telephone Encounter (Signed)
Provider: Gershon Cull, MD  Last appointment: 10/07/17  Next appointment: None   Last prescribed date per EMR: Drug Name Albuterol Date 10/07/17 #8.5g, RF-3  Med last filled: 06/01/18  Med last discussed in clinic note: 10/07/17 - A/P: #Asthma  Well controlled. No wheezing on exam today. No history of asthma related hospitalizations or intubations. Triggered by seasaonal allergies, exercise, and cold.   - Albuterol (PROAIR HFA, PROVENTIL HFA, VENTOLIN HFA) 90 mcg/actuation inhaler; Take 2 puffs by inhalation every 4 hours if needed.  Dispense: 8.5 g; Refill: 3  - Fluticasone (FLOVENT HFA) 110 mcg/actuation Inhaler; Take 1 puff by inhalation every 12 hours.  Dispense: 12 g; Refill: 6  Indication: see above  Labs (if required by protocol): N/A   Meets Protocol: Yes: Refill request has been pended to the pharmacist for review per P&T Protocol 06/19/2018

## 2018-10-15 ENCOUNTER — Ambulatory Visit (HOSPITAL_COMMUNITY)
Admission: EM | Admit: 2018-10-15 | Discharge: 2018-10-15 | Disposition: A | Discharge status: Home / Self Care | Payer: BLUE CROSS/BLUE SHIELD

## 2018-10-15 ENCOUNTER — Other Ambulatory Visit: Payer: Self-pay

## 2018-10-15 ENCOUNTER — Encounter (HOSPITAL_COMMUNITY): Payer: Self-pay

## 2018-10-15 DIAGNOSIS — M109 Gout, unspecified: Secondary | ICD-10-CM

## 2018-10-15 NOTE — Discharge Instructions (Addendum)
I believe it is safe for you to return to work  Follow-up with your primary care as needed

## 2018-10-15 NOTE — ED Provider Notes (Signed)
[facility]    CSN: [id number] Arrival date & time: 10/15/18  1018     History   Chief Complaint Chief Complaint  Patient presents with  . Foot Pain    HPI Roberto Price is a 53 y.o. male.   Patient is a 53 year old male with past medical history of hypertension, gout.  He presents today needing a note to return to work.  He had previous gout flareup last week that has resolved.  He resolved this by taking ibuprofen, soaking foot and resting.  He is a Naval architect.  Denies any current pain, swelling or erythema.  He is able to ambulate on the foot with no problem.  Feels he is fully capable of returning to work without any issues.  ROS per HPI      Past Medical History:  Diagnosis Date  . Hypertension   . Right wrist fracture    managed with cast    There are no active problems to display for this patient.   History reviewed. No pertinent surgical history.     Home Medications    Prior to Admission medications   Medication Sig Start Date End Date Taking? Authorizing Provider  ibuprofen (ADVIL,MOTRIN) 600 MG tablet Take 1 tablet (600 mg total) by mouth every 6 (six) hours as needed. 04/23/18   Wieters, Hallie C, PA-C  lisinopril-hydrochlorothiazide (PRINZIDE,ZESTORETIC) 20-12.5 MG per tablet Take 1 tablet by mouth daily.    [provider]  UNKNOWN TO PATIENT     [provider]    Family History Family History  Problem Relation Age of Onset  . Diabetes Mother   . Diabetes Father   . Diabetes Sister   . Hypertension Sister   . Diabetes Sister   . Hypertension Sister     Social History Social History   Tobacco Use  . Smoking status: Never Smoker  . Smokeless tobacco: Never Used  Substance Use Topics  . Alcohol use: Yes    Comment: beer occasionally  . Drug use: No     Allergies   Patient has no known allergies.   Review of Systems Review of Systems   Physical Exam Triage Vital Signs ED Triage Vitals   Enc Vitals Group     BP 10/15/18 1052 (!) 159/105     Pulse Rate 10/15/18 1052 73     Resp 10/15/18 1052 20     Temp 10/15/18 1052 99.3 F (37.4 C)     Temp src --      SpO2 10/15/18 1052 97 %     Weight --      Height --      Head Circumference --      Peak Flow --      Pain Score 10/15/18 1054 2     Pain Loc --      Pain Edu? --      Excl. in GC? --    No data found.  Updated Vital Signs BP (!) 159/105   Pulse 73   Temp 99.3 F (37.4 C)   Resp 20   SpO2 97%   Visual Acuity Right Eye Distance:   Left Eye Distance:   Bilateral Distance:    Right Eye Near:   Left Eye Near:    Bilateral Near:     Physical Exam Vitals signs and nursing note reviewed.  Constitutional:      General: He is not in acute distress.    Appearance: Normal appearance. He is not  ill-appearing, toxic-appearing or diaphoretic.  HENT:     Head: Normocephalic and atraumatic.     Nose: Nose normal.  Eyes:     Conjunctiva/sclera: Conjunctivae normal.  Neck:     Musculoskeletal: Normal range of motion.  Pulmonary:     Effort: Pulmonary effort is normal.  Musculoskeletal: Normal range of motion.        General: No swelling, tenderness, deformity or signs of injury.  Skin:    General: Skin is warm and dry.  Neurological:     Mental Status: He is alert.  Psychiatric:        Mood and Affect: Mood normal.      UC Treatments / Results  Labs (all labs ordered are listed, but only abnormal results are displayed) Labs Reviewed - No data to display  EKG None  Radiology No results found.  Procedures Procedures (including critical care time)  Medications Ordered in UC Medications - No data to display  Initial Impression / Assessment and Plan / UC Course  I have reviewed the triage vital signs and the nursing notes.  Pertinent labs & imaging results that were available during my care of the patient were reviewed by me and considered in my medical decision making (see chart for  details).     Safe to return to work.  Symptoms have resolved.  Work form filled out as requested. Final Clinical Impressions(s) / UC Diagnoses   Final diagnoses:  Acute gout of right ankle, unspecified cause     Discharge Instructions     I believe it is safe for you to return to work  Follow-up with your primary care as needed    ED Prescriptions    None     Controlled Substance Prescriptions [place] Controlled Substance Registry consulted? Not Applicable   Janace ArisBast, Batsheva Stevick A, NP 10/15/18 1131

## 2018-10-15 NOTE — ED Triage Notes (Signed)
Pt was seen earlier for foot pain and swelling, employer needs form filled out by provider to return to work

## 2018-11-27 ENCOUNTER — Encounter: Payer: Self-pay | Admitting: INTERNAL MEDICINE

## 2018-11-27 NOTE — Progress Notes (Signed)
Internal Medicine Graduating Resident Chart Update  PCP: Gershon Cull, MD    I have met this patient (yes/no): no    Active and pertinent chronic issues:  OSA on CPAP  Asthma  GERD  Seasonal allergies   Weight gain    Controlled substance agreement (yes/no): no    Needed follow up from new PCP, urgent issues, other recommendations:  F/u weight loss efforts    Outstanding health care maintenance:  Shingrix  HIV screen    Specify time needed for next follow-up appointment: 05/2019    Seabron Spates, M.D.  Internal Medicine, PGY-3  Pager: [phone]  PI: [phone]

## 2019-04-26 ENCOUNTER — Telehealth: Payer: Self-pay | Admitting: INTERNAL MEDICINE

## 2019-04-26 DIAGNOSIS — Z114 Encounter for screening for human immunodeficiency virus [HIV]: Secondary | ICD-10-CM

## 2019-04-26 DIAGNOSIS — E669 Obesity, unspecified: Secondary | ICD-10-CM

## 2019-04-26 NOTE — Telephone Encounter (Signed)
Caller Name: Casen    Reason for call: Lab Orders Request    Call back number: [phone]    Message to relay:     Requesting lab orders be placed for patient to complete before his appointment with Dr. Silverio Decamp on 11/17. Please advise.       Thank you,  Almira Coaster II   Ext: [phone]  Internal [place]

## 2019-04-29 NOTE — Telephone Encounter (Signed)
Hello,    I have attempted without success to contact this patient by phone. A message was left to call the clinic back at [phone].      IMPHONEROOM: Please review the message below       Thank you,     Dorian Valdovin, MOSC II  [facility]  Patient Line: [phone]  Direct Fax: [phone]

## 2019-04-29 NOTE — Telephone Encounter (Signed)
Noted  Labs ordered

## 2019-04-29 NOTE — Addendum Note (Signed)
Addended by: Elisabeth Most on: 04/29/2019 12:34 PM     Modules accepted: Orders

## 2019-05-01 ENCOUNTER — Ambulatory Visit: Payer: Commercial Managed Care - PPO | Attending: Internal Medicine

## 2019-05-01 DIAGNOSIS — E669 Obesity, unspecified: Secondary | ICD-10-CM | POA: Insufficient documentation

## 2019-05-01 DIAGNOSIS — Z114 Encounter for screening for human immunodeficiency virus [HIV]: Secondary | ICD-10-CM | POA: Insufficient documentation

## 2019-05-01 LAB — COMPREHENSIVE METABOLIC PANEL
Alanine Transferase (ALT): 21 U/L (ref 6–63)
Albumin: 3.9 g/dL (ref 3.4–4.8)
Alkaline Phosphatase (ALP): 66 U/L (ref 35–115)
Aspartate Transaminase (AST): 20 U/L (ref 15–43)
Bilirubin Total: 0.6 mg/dL (ref 0.3–1.3)
Calcium: 9 mg/dL (ref 8.6–10.5)
Carbon Dioxide Total: 25 mmol/L (ref 24–32)
Chloride: 104 mmol/L (ref 95–110)
Creatinine Serum: 1.24 mg/dL (ref 0.44–1.27)
E-GFR Creatinine (Male): 66 mL/min/{1.73_m2}
E-GFR, African American (Male): 77 mL/min/{1.73_m2}
Glucose: 89 mg/dL (ref 70–99)
Potassium: 3.9 mmol/L (ref 3.3–5.0)
Protein: 6.6 g/dL (ref 6.3–8.3)
Sodium: 139 mmol/L (ref 135–145)
Urea Nitrogen, Blood (BUN): 17 mg/dL (ref 8–22)

## 2019-05-01 LAB — CBC WITH DIFFERENTIAL
Basophils % Auto: 0.7 %
Basophils Abs Auto: 0 10*3/uL (ref 0.0–0.2)
Eosinophils % Auto: 2.9 %
Eosinophils Abs Auto: 0.2 10*3/uL (ref 0.0–0.5)
Hematocrit: 43.1 % (ref 41.0–53.0)
Hemoglobin: 14.5 g/dL (ref 13.5–17.5)
Lymphocytes % Auto: 31.3 %
Lymphocytes Abs Auto: 1.8 10*3/uL (ref 1.0–4.8)
MCH: 29.4 pg (ref 27.0–33.0)
MCHC: 33.7 % (ref 32.0–36.0)
MCV: 87.1 fL (ref 80.0–100.0)
MPV: 9.3 fL (ref 6.8–10.0)
Monocytes % Auto: 10.3 %
Monocytes Abs Auto: 0.6 10*3/uL (ref 0.1–0.8)
Neutrophils % Auto: 54.8 %
Neutrophils Abs Auto: 3.1 10*3/uL (ref 1.8–7.7)
Platelet Count: 193 10*3/uL (ref 130–400)
RDW: 12.8 % (ref 0.0–14.7)
Red Blood Cell Count: 4.94 10*6/uL (ref 4.50–5.90)
White Blood Cell Count: 5.7 10*3/uL (ref 4.5–11.0)

## 2019-05-01 LAB — LIPID PANEL WITH DLDL REFLEX
Cholesterol: 173 mg/dL (ref 0–200)
HDL Cholesterol: 53 mg/dL (ref >=35)
LDL Cholesterol Calculation: 109 mg/dL (ref <130)
Non-HDL Cholesterol: 120 mg/dL (ref <=160)
Total Cholesterol: HDL Ratio: 3.3 (ref <4.0)
Triglyceride Level: 54 mg/dL (ref 35–160)

## 2019-05-01 LAB — HEMOGLOBIN A1C
Hgb A1C,Glucose Est Avg: 114 mg/dL
Hgb A1C: 5.6 % (ref 3.9–5.6)

## 2019-05-01 LAB — HIV AG/AB COMBO SCREEN: HIV Ag/Ab Combo Screen: NONREACTIVE

## 2019-05-03 NOTE — Progress Notes (Signed)
Internal Medicine Clinic Note    Name: Alex Werner  MRN: [id number]      Assessment/Plan:  53 y/o man with a history of GERD, OSA on CPAP, asthma, and seasonal allergies who presents for health check up.    # GAD  H/o anxiety 10 years ago and was taking SSRI at that time with unclear benefit, has been controlled for a long time now but now worsening. Likely generalized anxiety, no specific trigger, with recent worsening in the past 2-3 months in the setting of psychosocial stressors from work Production manager, VP, manages hundreds of people) and COVID. Patient already attempting to alleviate his symptoms with mindfulness and increased exercise. He is open to re-starting medications if his symptoms persistent or worsen. Counseled patient to call his insurance and see what mental health benefits is covered for him, he was also encouraged to see mental health specialists here at [facility] if he desires.  - Continue exercise, mindfulness, meditation  - Consider SSRI if worsening/persistent symptoms  - Encouraged to f/u with mental health expert    # Decreased libido  Recently with no sexual urge/desire in the last 3 months. Reports he had desire for sex or masturbation at least once a week previously. Continues to have morning erections daily which supports psychosocial/performance/anxiety etiology, rather than an organic issue. Believes there may be a correlation with his worsening anxiety and decreased libido. In the past, he was getting testosterone supplementation due to low testosterone which also may explain his low libido, will re-check testosterone panel (last checked in 2013). Discussed with patient the benefits/risks of testosterone supplementation, particularly with prostate issues.  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 Per patient, it has recurred and he would like a dermatology referral for further evaluation and management.  - Derm referral placed    # Left knee pain and swelling  Patient with intermittent left knee pain and swelling. Enjoys hiking, and is concerned for osteoarthritis of his L knee. Will obtain weight bearing L knee XR to evaluate. In the meantime, he was encouraged to continue exercising to strengthen muscles around the knee as well as for weight loss.  - F/u XR    # Right toe pain  Patient with pain in the DIP joint of R big toe of unclear reasons. Will obtain right foot XR for further evaluation  - F/u XR    # Asthma  Well controlled. No wheezing on exam today. No history of asthma related hospitalizations or intubations. Triggered by seasaonal allergies, exercise, and cold.  - Continue albuterol PRN and fluticasone    # HCM  - Flu shot done  - Shingles vaccine ordered  - Last visit HIV neg, A1c 5.6, CBC/BMP/LFT wnl, lipid pnael wnl  Health Maintenance   Topic Date Due    Shingrix (Zoster) (1 of 2) 10/03/2016    COLONOSCOPY  11/15/2022    DTAP/TDAP/TD (3 - Td) 10/08/2027    Pneumococcal Vaccine 0-64  Completed    HIV SCREENING  Completed    INFLUENZA  Completed       Patient WAS wearing a surgical mask  Contact and Droplet precautions were followed when caring for the patient.   PPE used by provider during encounter: Surgical mask and Face Shield/Goggles    Follow up: with PCP    Alex Werner  Leighanne Adolph, MD  Internal Medicine PGY1  [facility] [place]  [id number]  PI [id number]        Alex Werner is a 53yr old male in clinic today for evaluation.    CC: f/u labs    HPI:  53 y/o man with a history of GERD, OSA on CPAP, asthma, and seasonal allergies who presents for general check up.    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 He says he's had anxiety for several years now, but his anxiety has been well-controlled in the past few years without issue. He has tried SSRIs in the past and wasn't sure if it helpful for him or not. He has tried to manage his anxiety better by mindfulness techniques and increasing exercise. In addition to his anxiety, he noticed decreased libido in which he would previously have sexual desire/masturbation at least once a week, but now he has had no desire in the last 3 months which is straining his relationship with his wife. However, he continues to endorse daily morning erections.    His other concern is a recurring lesion in his left elbow, he stated he had a dermatologist remove it and biopsy it before in 2001, but now it has recurred and becoming more painful as well.    ROS:  See HPI above for pertinent positives/negatives.    Allergies:    No Known Allergies    Other-Reaction in Comments    Comment:N/A    Medications:   Outpatient Medications Marked as Taking for the 05/04/19 encounter (Office Visit) with Jeanella Craze, MD   Medication Sig Dispense Refill    Albuterol (PROAIR HFA, PROVENTIL HFA, VENTOLIN HFA) 90 mcg/actuation inhaler Take 2 puffs by inhalation every 4 hours if needed. 8.5 g 2    Albuterol (PROAIR HFA, PROVENTIL HFA, VENTOLIN HFA) 90 mcg/actuation inhaler Take 2 puffs by inhalation every 4 hours if needed. 8.5 g 2    Fluticasone (FLOVENT HFA) 110 mcg/actuation Inhaler Take 1 puff by inhalation every 12 hours. 36 g 1       History:  I did review patient's past medical and family/social history, no changes noted.       Physical Exam:  BP 134/80   Pulse 73   Temp 36.1 C (97 F) (Tympanic)   Ht 1.778 m (5\' 10" )   Wt 101.6 kg (223 lb 15.8 oz)   SpO2 98%   BMI 32.14 kg/m     GEN: Well appearing middle aged male, appears comfortable and in no acute distress  HEENT: Normal conjunctiva, normal oropharynx, moist mucous membranes  CV: RRR, normal  S1 and S2, no m/g/r  LUNGS: CTAB, no wheezes or crackles, breathing comfortably on RA  ABD: Soft, nondistended, nontender to palpation, non-peritonitic  EXT: WWP, no peripheral edema  SKIN: No rashes or ulcers  NEURO: Alert and oriented.       Labs/Tests/Imaging:   I reviewed the following laboratory or imaging/study reports, including:     Results for GARRETT, FRALIX (MRN [id number]) as of 05/03/2019 14:03   Ref. Range 05/01/2019 07:50   SODIUM Latest Ref Range: 135 - 145 mmol/L 139   POTASSIUM Latest Ref Range: 3.3 - 5.0 mmol/L 3.9   CHLORIDE Latest Ref Range: 95 - 110 mmol/L 104   CARBON DIOXIDE TOTAL Latest Ref Range: 24 -  32 mmol/L 25   UREA NITROGEN, BLOOD (BUN) Latest Ref Range: 8 - 22 mg/dL 17   CREATININE BLOOD Latest Ref Range: 0.44 - 1.27 mg/dL 1.24   GLUCOSE Latest Ref Range: 70 - 99 mg/dL 89   CALCIUM Latest Ref Range: 8.6 - 10.5 mg/dL 9.0   PROTEIN Latest Ref Range: 6.3 - 8.3 g/dL 6.6   ALBUMIN Latest Ref Range: 3.4 - 4.8 g/dL 3.9   ALKALINE PHOSPHATASE (ALP) Latest Ref Range: 35 - 115 U/L 66   ASPARTATE TRANSAMINASE (AST) Latest Ref Range: 15 - 43 U/L 20   BILIRUBIN TOTAL Latest Ref Range: 0.3 - 1.3 mg/dL 0.6   ALANINE TRANSFERASE (ALT) Latest Ref Range: 6 - 63 U/L 21   E-GFR, AFRICAN AMERICAN Latest Ref Range: >60 See Note mL/min/1.93m*2 77   E-GFR, NON-AFRICAN AMERICAN Latest Ref Range: >60 See Note mL/min/1.70m*2 66   CHOLESTEROL Latest Ref Range: 0 - 200 mg/dL 173   TRIGLYCERIDE Latest Ref Range: 35 - 160 mg/dL 54   LDL CHOLESTEROL CALCULATION Latest Ref Range: <130 mg/dL 109   HDL CHOLESTEROL Latest Ref Range: >=35 mg/dL 53   NON-HDL CHOLESTEROL Latest Ref Range: <=160 mg/dL 120   TOTAL CHOLESTEROL:HDL RATIO Latest Ref Range: <4.0  3.3     Results for TOLGA, VOLKMAN (MRN [id number]) as of 05/03/2019 14:03   Ref. Range 05/01/2019 07:50   White Blood Cell Count Latest Ref Range: 4.5 - 11.0 K/MM3 5.7   Red Blood Cell Count Latest Ref Range: 4.50 - 5.90 M/MM3 4.94   Hemoglobin Latest Ref Range:  13.5 - 17.5 g/dL 14.5   Hematocrit Latest Ref Range: 41.0 - 53.0 % 43.1   MCV Latest Ref Range: 80.0 - 100.0 fL 87.1   MCH Latest Ref Range: 27.0 - 33.0 pg 29.4   MCHC Latest Ref Range: 32.0 - 36.0 % 33.7   RDW Latest Ref Range: 0.0 - 14.7 % 12.8   Platelet Count Latest Ref Range: 130 - 400 K/MM3 193   MPV Latest Ref Range: 6.8 - 10.0 fL 9.3     Results for SANJAY, MINTZER (MRN [id number]) as of 05/03/2019 14:03   Ref. Range 05/01/2019 07:50   HGB A1C Latest Ref Range: 3.9 - 5.6 % 5.6     Results for NICHOLES, HUML (MRN [id number]) as of 05/03/2019 14:03   Ref. Range 05/01/2019 07:50   HIV AG/AB COMBO SCREEN Latest Ref Range: Nonreactive  Nonreactive       Assessment/Plan:  Assessment and plan has been moved to the top of the note for clarity and ease of reading.    EDUCATION:  I educated/instructed the patient or caregiver regarding all aspects of the above stated plan of care.  The patient or caregiver indicated understanding.      [place] interpreter was not used.

## 2019-05-04 ENCOUNTER — Ambulatory Visit (HOSPITAL_BASED_OUTPATIENT_CLINIC_OR_DEPARTMENT_OTHER)
Admission: RE | Admit: 2019-05-04 | Discharge: 2019-05-04 | Disposition: A | Discharge status: Home / Self Care | Payer: Commercial Managed Care - PPO | Source: Ambulatory Visit | Attending: Internal Medicine | Admitting: Internal Medicine

## 2019-05-04 ENCOUNTER — Ambulatory Visit
Admission: RE | Admit: 2019-05-04 | Discharge: 2019-05-04 | Disposition: A | Discharge status: Home / Self Care | Payer: Commercial Managed Care - PPO | Source: Ambulatory Visit | Attending: Internal Medicine | Admitting: Internal Medicine

## 2019-05-04 ENCOUNTER — Encounter

## 2019-05-04 ENCOUNTER — Ambulatory Visit: Payer: Commercial Managed Care - PPO

## 2019-05-04 ENCOUNTER — Other Ambulatory Visit: Payer: Self-pay | Admitting: Internal Medicine

## 2019-05-04 ENCOUNTER — Encounter: Payer: Self-pay | Admitting: Student in an Organized Health Care Education/Training Program

## 2019-05-04 ENCOUNTER — Ambulatory Visit (HOSPITAL_BASED_OUTPATIENT_CLINIC_OR_DEPARTMENT_OTHER): Payer: Commercial Managed Care - PPO | Admitting: Student in an Organized Health Care Education/Training Program

## 2019-05-04 VITALS — BP 134/80 | HR 73 | Temp 97.0°F | Ht 70.0 in | Wt 224.0 lb

## 2019-05-04 DIAGNOSIS — M19071 Primary osteoarthritis, right ankle and foot: Secondary | ICD-10-CM

## 2019-05-04 DIAGNOSIS — Z0000 Encounter for general adult medical examination without abnormal findings: Secondary | ICD-10-CM

## 2019-05-04 DIAGNOSIS — J45909 Unspecified asthma, uncomplicated: Secondary | ICD-10-CM

## 2019-05-04 DIAGNOSIS — M25562 Pain in left knee: Secondary | ICD-10-CM

## 2019-05-04 DIAGNOSIS — G4733 Obstructive sleep apnea (adult) (pediatric): Secondary | ICD-10-CM

## 2019-05-04 DIAGNOSIS — M25462 Effusion, left knee: Secondary | ICD-10-CM

## 2019-05-04 DIAGNOSIS — F411 Generalized anxiety disorder: Secondary | ICD-10-CM

## 2019-05-04 DIAGNOSIS — M79674 Pain in right toe(s): Secondary | ICD-10-CM

## 2019-05-04 DIAGNOSIS — Z9989 Dependence on other enabling machines and devices: Secondary | ICD-10-CM

## 2019-05-04 DIAGNOSIS — D1800 Hemangioma unspecified site: Secondary | ICD-10-CM

## 2019-05-04 DIAGNOSIS — K219 Gastro-esophageal reflux disease without esophagitis: Secondary | ICD-10-CM

## 2019-05-04 DIAGNOSIS — R6882 Decreased libido: Secondary | ICD-10-CM

## 2019-05-04 DIAGNOSIS — M1712 Unilateral primary osteoarthritis, left knee: Secondary | ICD-10-CM

## 2019-05-04 NOTE — Nursing Note (Signed)
Vital signs taken, allergies verified, screened for pain, tobacco hx verified.      Patient WAS wearing a surgical mask  Droplet precautions were followed when caring for the patient.   PPE used by provider during encounter: Surgical mask and Face Shield/Goggles    Lovelle Lema, MA II

## 2019-05-04 NOTE — Patient Instructions (Signed)
1. We recommend you see our behavioral health specialists to help with anxiety. Continue meditation, mindfulness, and exercise to help with your symptoms. We can discuss medication in the future if your symptoms persistent.    2. Please get your labs drawn to check your testosterone levels.    Return to clinic as needed.

## 2019-05-05 LAB — TESTOSTERONE PANEL
Sex Hormone Binding Glob: 49 nmol/L (ref 10–57)
Testosterone, Free Calculated: 54 pg/mL (ref 20–135)
Testosterone, Free Percent: 1.5 % (ref 1.5–3.2)
Testosterone, Total: 348 ng/dL (ref 129–767)

## 2019-05-05 NOTE — Progress Notes (Signed)
TEACHING PHYSICIAN INVOLVEMENT/ATTENDING PHYSICIAN NOTE      Mr. Crookston was seen and evaluated, and the care plan was jointly developed with Dr. Jeanella Craze (resident physician).  I agree with the findings and with the assessment and plan as outlined in the resident's note.      Suzy Bouchard, MD MS [facility]  Health Sciences Clinical Professor of Internal Medicine

## 2019-08-30 ENCOUNTER — Encounter: Payer: Self-pay | Admitting: INTERNAL MEDICINE

## 2019-08-30 DIAGNOSIS — J45909 Unspecified asthma, uncomplicated: Secondary | ICD-10-CM

## 2019-08-31 MED ORDER — FLOVENT HFA 110 MCG/ACTUATION AEROSOL INHALER
1.0000 | INHALATION_SPRAY | Freq: Two times a day (BID) | RESPIRATORY_TRACT | 1 refills | Status: DC
Start: 2019-08-31 — End: 2022-01-13

## 2019-08-31 MED ORDER — ALBUTEROL SULFATE HFA 90 MCG/ACTUATION AEROSOL INHALER: 2.0000 | INHALATION_SPRAY | RESPIRATORY_TRACT | 2 refills | Status: DC | PRN

## 2019-09-16 ENCOUNTER — Ambulatory Visit: Payer: Commercial Managed Care - PPO

## 2019-09-16 DIAGNOSIS — Z23 Encounter for immunization: Secondary | ICD-10-CM

## 2019-09-16 NOTE — Progress Notes (Signed)
Patient WAS wearing a surgical mask  Droplet precautions were followed when caring for the patient.   PPE used by provider during encounter: Surgical mask, Face Shield/Goggles and Gloves    The patient is eligible to receive the COVID-19 Vaccine at this time: Yes    The patient has received a COVID vaccine previously: No      The patient acknowledges receipt of the Fact Sheet for Recipients and Caregivers for the Pfizer-BioNtech vaccine, which includes information about the risks and benefits of the vaccine and available alternatives. The patient was informed the FDA has authorized the emergency use of the COVID-19 Vaccine, which is not an FDA-approved vaccine and that the patient may accept or refuse the vaccine.    Pre-Screening Documentation:          COVID-19 Immunization Allergies  1. Have you ever had a severe allergic reaction to a previous dose of the COVID-19 Vaccine or to any ingredient of the COVID-19 Vaccine? (Review ingredients in the Fact Sheet for Recipients and Caregivers): No  2. Have you ever had an immediate allergic reaction of any severity within 4 hours of receiving a previous dose of the COVID-19 Vaccine or any of its ingredients?: No  3. Have you ever had an immediate allergic reaction of any severity to polysorbate or polyethylene glycol [PEG]?: No  Did the patient answer yes to any of questions 1-3?: No (proceed to question 4)      COVID-19 Immunization Clinical Considerations  4.Are you pregnant or breastfeeding?: No  5.Do you have a weakened immune system caused by something such as HIV infection or cancer, or do you take medications that affect the immune system?: No  6. Do you have a bleeding disorder or are you taking blood thinners? : No  Did the patient answer yes to any of questions 4-6? : No (proceed to question 7)      COVID-19 Immunization Deferral Considerations  7. Have you received any other vaccines within the past 14 days? : No  8. Have you had a confirmed COVID-19  exposure in the past 14 days?: No  9. Have you tested positive for COVID-19 in the last 14 days? : No  10. Are you feeling sick today? : No  Did the patient answer yes to any of questions 7-10?: No (proceed to question 11)       COVID-19 Immunization Antibody Deferral Considerations  11. In the past 90 days, have you tested positive for COVID-19 and recieved treatment?: No (proceed to question 12)       COVID-19 Immunization Dermal Filler Consideration  12. Have you received a dermal filler injection?: No (proceed to question 13)      COVID-19 Immunization Observation Considerations  13. Do you have a history of immediate allergic reaction of any severity to a vaccine?: No  14. Do you have a history of immediate allergic reaction of any severity to an injectable medication?: No  15. Do you have a history of severe allergic reaction due to any cause? : No  Did the patient answer yes to any of questions 13-15? : No  If no, the patient was informed a 15-minute observation period is recommended after the vaccine to watch for a reaction.: Confirmed        All patient questions were answered and the patient agrees to receive the COVID-19 vaccine today.     Vaccine prepared and administered according to the current Emergency Use Authorization and Fact Sheet for Healthcare Providers   Administering Vaccine.    Patient given vaccine card and discharge instructions with information about the Fact Sheet for Recipients and Caregivers and the v-safe program. Patient directed to observation area.     Alex Werner, LVN

## 2019-10-09 ENCOUNTER — Ambulatory Visit: Payer: Commercial Managed Care - PPO | Attending: Family Medicine

## 2019-10-09 DIAGNOSIS — Z23 Encounter for immunization: Secondary | ICD-10-CM

## 2019-10-09 NOTE — Progress Notes (Signed)
Patient WAS wearing a surgical mask  Contact and Droplet precautions were followed when caring for the patient.   PPE used by provider during encounter: Surgical mask, Face Shield/Goggles and Gloves    The patient is eligible to receive the COVID-19 Vaccine at this time: Yes    The patient has received a COVID vaccine previously: Yes   Immunization History   Administered Date(s) Administered    COVID-19 mRNA PF 30 mcg/0.3 mL (Pfizer)   09/16/2019            The patient acknowledges receipt of the Fact Sheet for Recipients and Caregivers for the Pfizer-BioNtech vaccine, which includes information about the risks and benefits of the vaccine and available alternatives. The patient was informed the FDA has authorized the emergency use of the COVID-19 Vaccine, which is not an FDA-approved vaccine and that the patient may accept or refuse the vaccine.    Pre-Screening Documentation:          COVID-19 Immunization Allergies  1. Have you ever had a severe allergic reaction to a previous dose of the COVID-19 Vaccine or to any ingredient of the COVID-19 Vaccine? (Review ingredients in the Fact Sheet for Recipients and Caregivers): No  2. Have you ever had an immediate allergic reaction of any severity within 4 hours of receiving a previous dose of the COVID-19 Vaccine or any of its ingredients?: No  3. Have you ever had an immediate allergic reaction of any severity to polysorbate or polyethylene glycol [PEG]?: No  Did the patient answer yes to any of questions 1-3?: No (proceed to question 4)      COVID-19 Immunization Clinical Considerations  4.Are you pregnant or breastfeeding?: No  5.Do you have a weakened immune system caused by something such as HIV infection or cancer, or do you take medications that affect the immune system?: No  6. Do you have a bleeding disorder or are you taking blood thinners? : No  Did the patient answer yes to any of questions 4-6? : No (proceed to question 7)      COVID-19 Immunization  Deferral Considerations  7. Have you received any other vaccines within the past 14 days? : No  8. Have you had a confirmed COVID-19 exposure in the past 14 days?: No  9. Have you tested positive for COVID-19 in the last 14 days? : No  10. Are you feeling sick today? : No  Did the patient answer yes to any of questions 7-10?: No (proceed to question 11)       COVID-19 Immunization Antibody Deferral Considerations  11. In the past 90 days, have you tested positive for COVID-19 and recieved treatment?: No (proceed to question 12)       COVID-19 Immunization Dermal Filler Consideration  12. Have you received a dermal filler injection?: No (proceed to question 13)      COVID-19 Immunization Observation Considerations  13. Do you have a history of immediate allergic reaction of any severity to a vaccine?: No  14. Do you have a history of immediate allergic reaction of any severity to an injectable medication?: No  15. Do you have a history of severe allergic reaction due to any cause? : No  Did the patient answer yes to any of questions 13-15? : No  If no, the patient was informed a 15-minute observation period is recommended after the vaccine to watch for a reaction.: Confirmed        All patient questions were answered and   the patient agrees to receive the COVID-19 vaccine today.     Vaccine prepared and administered according to the current Emergency Use Authorization and Fact Sheet for Healthcare Providers Administering Vaccine.    Patient given vaccine card and discharge instructions with information about the Fact Sheet for Recipients and Caregivers and the v-safe program. Patient directed to observation area.     Ellakate Gonsalves Esperanza de Guzman, RN

## 2019-12-13 ENCOUNTER — Encounter: Payer: Self-pay | Admitting: INTERNAL MEDICINE

## 2019-12-16 NOTE — Progress Notes (Signed)
Internal Medicine Graduating Resident Chart Update    I have met this patient (yes/no): no    Active and pertinent chronic issues:  Patient Active Problem List   Diagnosis    Esophageal reflux    Other testicular hypofunction    Asthma due to seasonal allergies    Lumbago    Sleep apnea on CPAP    Fatigue    Depression    Anxiety       Controlled substance agreement (yes/no): no    Needed follow up from new PCP, urgent issues, other recommendations:    - Please follow up on health care maintenance issues below.    Outstanding health care maintenance:  Health Maintenance   Topic Date Due    Shingrix (Zoster) (1 of 2) Never done    INFLUENZA  01/16/2020    Colorectal Cancer Screening  11/15/2022    DTAP/TDAP/TD (3 - Td or Tdap) 10/08/2027    Pneumococcal Vaccine 0-64 (2 of 2 - PPSV23) 10/04/2031    Covid-19 Vaccine  Completed    HIV SCREENING  Completed       Specify time needed for next follow-up appointment: Nov 2021

## 2020-05-19 ENCOUNTER — Ambulatory Visit: Payer: Commercial Managed Care - PPO

## 2020-05-19 DIAGNOSIS — Z23 Encounter for immunization: Secondary | ICD-10-CM

## 2020-05-19 NOTE — Nursing Note (Signed)
Influenza Vaccine Documentation:  The patient is receiving their Influenza Vaccine today: No    COVID-19 Vaccine Documentation:  The patient acknowledges receipt of the Fact Sheet for Recipients and Caregivers, which includes information about the risks and benefits of the vaccine and available alternatives, for the following vaccine manufacturer: Pfizer-BioNtech     The patient or person named in permission was informed the FDA has approved the [place] COVID-19 Vaccine as a 2-dose primary series for ages 71 and older. The FDA has authorized the emergency use of the [place] COVID-19 Vaccine as a primary series additional 3rd dose for immunocompromised individuals ages 58 and older and as a booster dose given after the completion of a primary series for ages 77 and older, which is not an FDA-approved vaccine additional dose or booster and that the patient may accept or refuse the vaccine.     Has the patient received a COVID vaccine previously: Yes   Immunization History   Administered Date(s) Administered        COVID-19 mRNA PF 30 mcg/0.3 mL (Pfizer)  COVID-19 mRNA PF 30 mcg/0.3 mL [employer])   09/16/2019   10/09/2019       Is the patient receiving a 3rd or Booster dose today? Yes, Booster dose     Confirmed the patient's last dose of Moderna/Pfizer was administered more than 6 months ago OR the patient's last dose of Alphonsa Overall was administered more than 2 months ago? Yes     Janssen (Johnson & Wynetta Emery) Vaccine Questions:  Is the patient receiving the Alphonsa Overall ([place]) COVID-19 Vaccine today? No    Pre-Screening/Eligibility Questions:  1. Does the patient have any of the following absolute contraindications for receiving the COVID-19 Vaccine?   No contraindications  Did the patient indicate a contraindication? No    2. Does the patient have a history of Myocarditis or Pericarditis or has the patient been told they have had inflammation of their heart or of the sac around their heart?  No    3. In the past  90 days, has the patient been diagnosed with Multisystem Inflammatory Syndrome (MIS-A or MIS-C) after a COVID-19 Infection?   No    4. Does the patient have any of the following reasons to defer the vaccine?   No deferrals    5. In the past 90 days, has the patient received Monoclonal Antibody or Convalescent Plasma treatment for COVID-19?   No    6. Does the patient have any of these clinic conditions or history?   No other clinical conditions  Did the patient indicate a clinical consideration?  No    7. Has the patient received a dermal filler injection?   No    8. Does the patient have any of the following conditions that warrant a longer post-vaccine observation period (30 minutes)?   No conditions to warrant longer observation  Did the patient indicate a condition warranting a longer post-vaccine observation period? No - Patient was informed a 15 minute observation period is recommended after the vaccine to watch for a reaction.     The patient is eligible to receive the COVID-19 Vaccine at this time? Yes     All patient questions were answered and the patient agrees to receive the COVID-19 vaccine today.   Vaccine prepared and administered according to the current Emergency Use Authorization and Fact Sheet for Healthcare Providers Administering Vaccine.  Patient given vaccine card and discharge instructions with information about the Fact Sheet for Recipients and  Caregivers and the v-safe program.      COVID-19 Vaccine Observation:  The patient was observed for 15 minutes for immediate reactions to the vaccine per protocol. No reaction was observed.      Alex Werner, [place]

## 2020-06-01 IMAGING — DX DG FOOT COMPLETE 3+V*L*
3 series · 3 of 3 positions shown · non-contrast
Comparison: None.

CLINICAL DATA: Left foot pain after fall 5 days ago, lateral sided.

EXAM:
LEFT FOOT - COMPLETE 3+ VIEW

[foot ap]
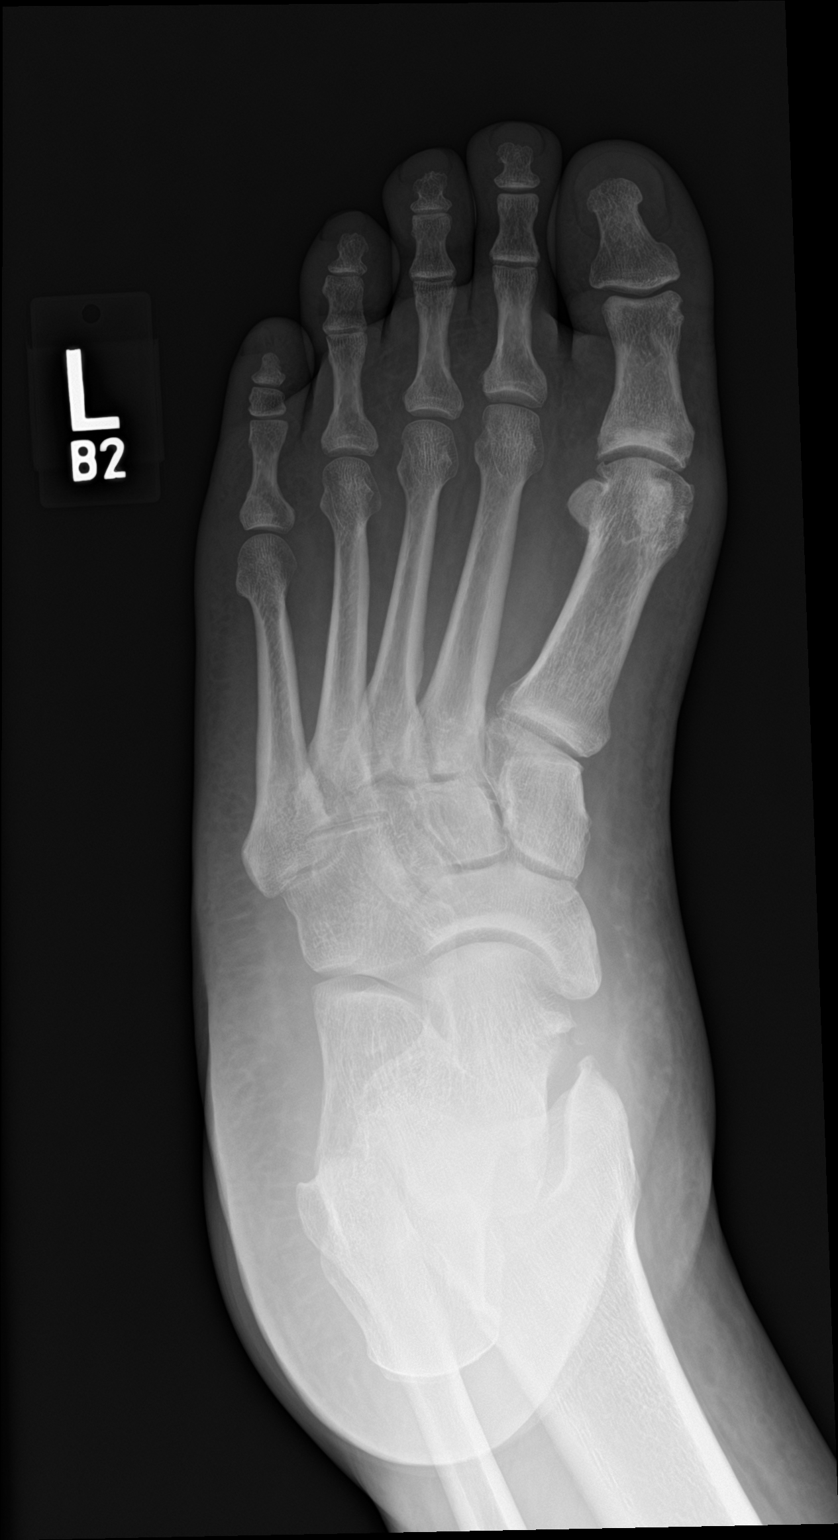

[foot obl]
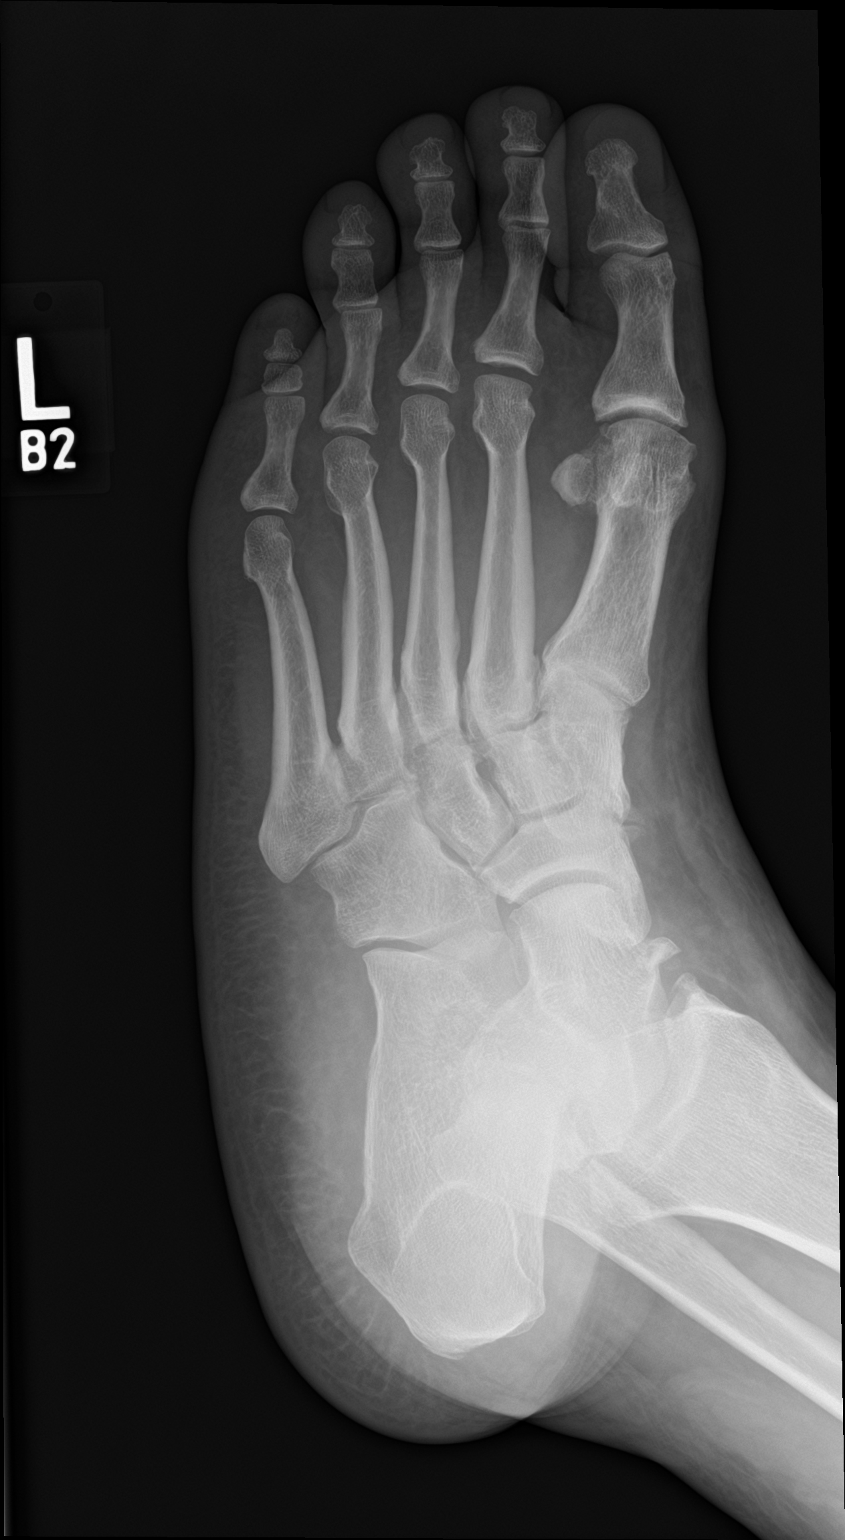

[foot lat]
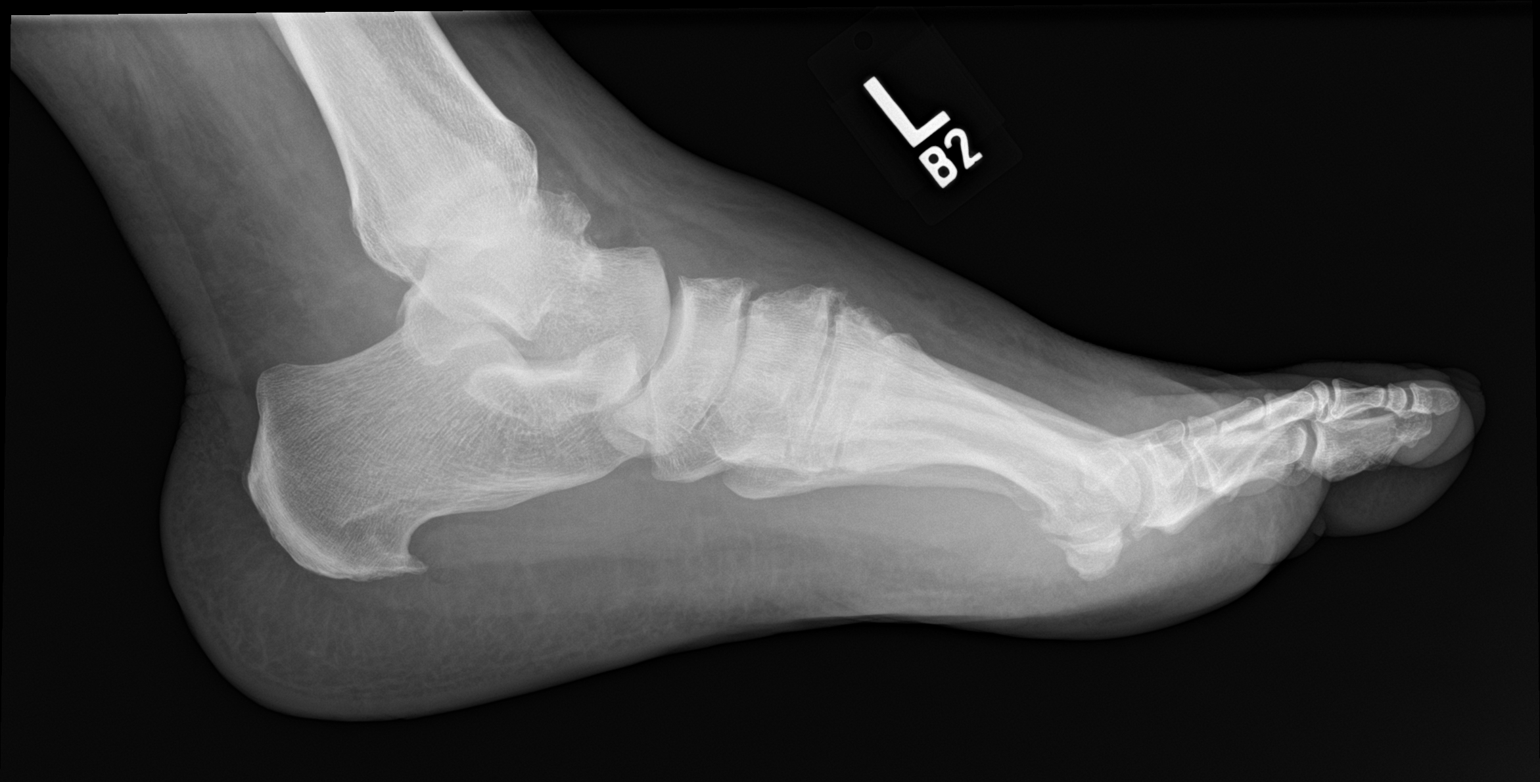

[3 of 3 positions shown; findings below may reference images not displayed]

FINDINGS: Nonspecific soft tissue swelling. No acute fracture or dislocation.
Degenerative midfoot spurring. Spurring about the ankle, midfoot,
and plantar heel.
IMPRESSION: Soft tissue swelling without acute osseous finding.

## 2020-06-01 IMAGING — DX DG ANKLE COMPLETE 3+V*L*
3 series · 3 of 3 positions shown · non-contrast
Comparison: None.

CLINICAL DATA: Left ankle injury.  Injured 5 days ago.

EXAM:
LEFT ANKLE COMPLETE - 3+ VIEW

[ankle ap]
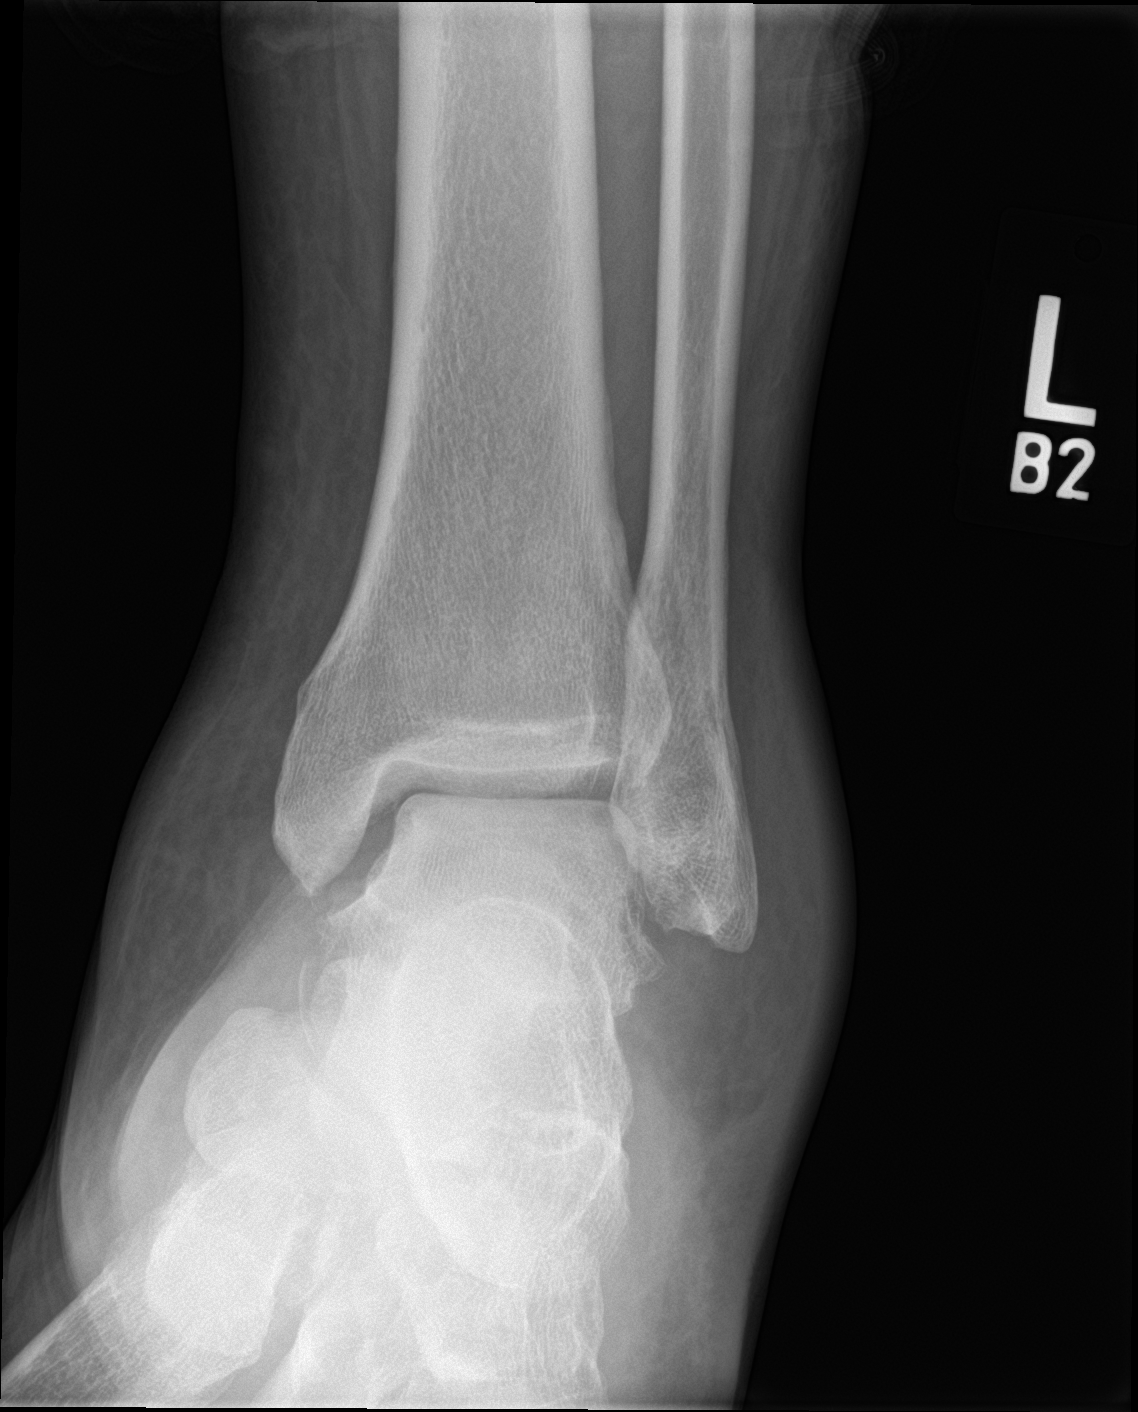

[ankle obl]
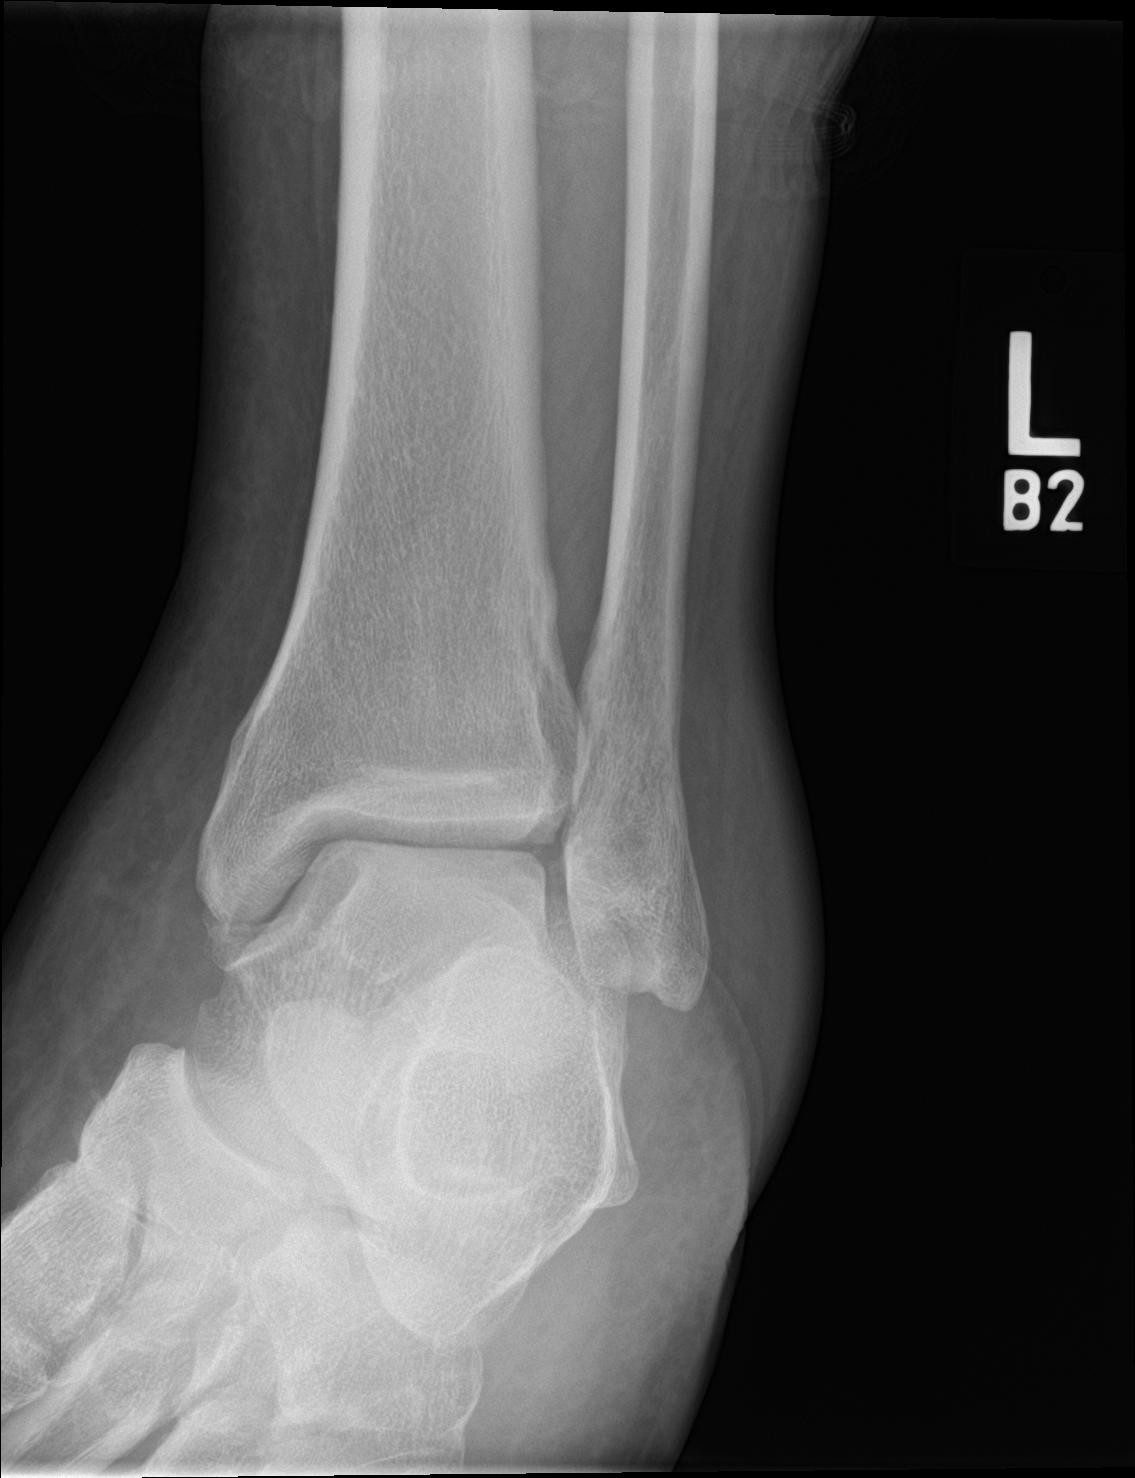

[ankle lat]
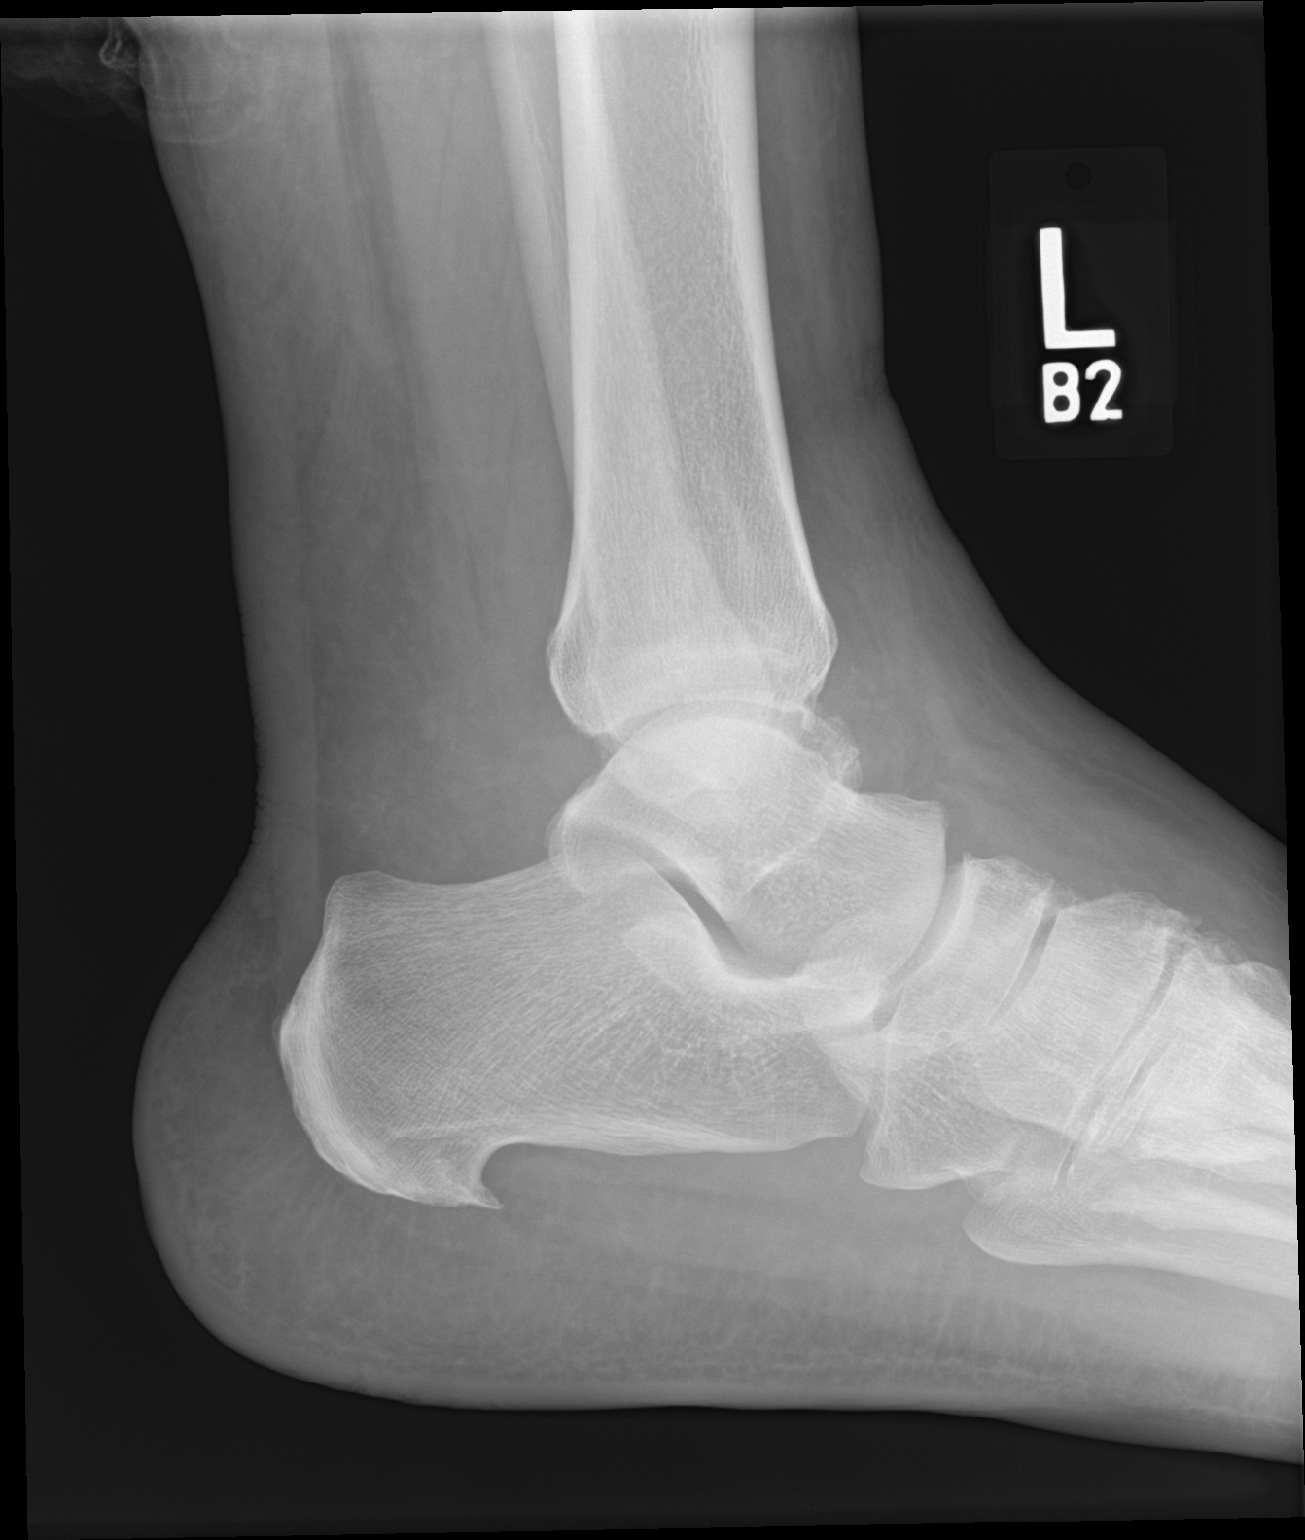

[3 of 3 positions shown; findings below may reference images not displayed]

FINDINGS: There is no evidence of fracture, dislocation, or joint effusion.
There is a possible small osteochondral lesion involving the medial
corner of the talar dome. There is a small plantar calcaneal spur.
There is mild osteoarthritis of the first TMT joint. Soft tissue
swelling around the left ankle and lower leg..
IMPRESSION: No acute osseous injury of the left ankle.

## 2020-06-05 ENCOUNTER — Ambulatory Visit: Payer: Commercial Managed Care - PPO

## 2020-06-05 DIAGNOSIS — Z23 Encounter for immunization: Secondary | ICD-10-CM

## 2020-06-05 NOTE — Nursing Note (Signed)
Influenza Vaccine Documentation:  The patient is receiving their Influenza Vaccine today: Yes     The patient has received a flu vaccine previously: yes    The Inactivated Influenza Vaccine VIS document for the flu injection was given to patient to review. All questions were referred to the physician.    The Screening Checklist for Contraindications to Inactivated Injectable Influenza Vaccination was completed:   Is the patient sick today?   Does the patient have an allergy to a component of the vaccine?   Has the patient ever had a serious reaction to the influenza vaccine in the past?   Has the patient ever had Guillain-Barre syndrome?  Did the patient answer "Yes" to any of the screening questions? No - The Influenza Vaccine was administered per Policy 2041 using the standing order.     The patient was observed for 15 minutes for immediate reactions to the vaccine per protocol. No reaction was observed.    Alex Werner, LVN

## 2020-07-13 NOTE — Progress Notes (Signed)
Internal Medicine Clinic Note     Name: Alex Werner   MRN: [id number]      Approximately 30 minutes were spent face to face with the patient, of which more than 50% was spent in counseling and/or coordination of care on anxiety treatment. The patient understands and agrees with the plan of care as outlined        Assessment/Plan:  52yr male with history of GERD, OSA on CPAP, asthma, and anxiety presenting with worsening anxiety.     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 Hx of anxiety since 2009, took citalopram initially with some benefit, but stopped several years ago. Anxiety otherwise controlled with mindfulness/diet & exercise until recently. Most likely due to generalized anxiety disorder given hx of longstanding anxiety about multiple aspects of life. Other possibilities include hyperthyroidism (last TSH wnl in 03/2012) or depression (less likely, patient states mood is good without suicidality). Interested in starting medication for anxiety and continuing marital counseling; expresses understanding of possible SEs (GI disturbance and decreased libido).  - Workup: TSH, CBC, CMP  - Start sertraline 25 mg daily. Check back in 6 weeks to assess for improvement. Plan to uptitrate as needed/tolerated  - Continue marital counseling, meditation, diet & exercise    #Flushing  Patient endorses occasional flushing over the past two weeks accompanying increased anxiety, no hx of prior episodes. Most likely due to anxiety (worries about different aspects of life as above) vs hyperthyroidism (also notes GI upset, though thyroid not enlarged; last TSH in 2013). Unlikely carcinoid tumor or pheochromocytoma, given lack of heart racing/palpitations or extreme BP spikes. Will assess TSH levels.  - Workup: TSH, CBC,  CMP    #Elevated BP  BP 150/88 on recheck, has had elevated in-office readings in the past. Likely a combination of white coat hypertension and anxiety. Will continue to monitor and reassess when anxiety is better controlled  - CTM    # Asthma  Well controlled. No wheezing on exam today. No history of asthma related hospitalizations or intubations. Triggered by seasaonal allergies, exercise, and cold.  - Continue albuterol PRN and fluticasone    # HCM  Patient believes that he completed the Shingrix vaccine series in 2020 but cannot recall exact # of doses. Completed 3-shot COVID series. Will check lipid panel for general health care maintenance.  - Check lipid panel    Follow up: 6-8 weeks (check on sertraline dose), then 1 year or as needed     Patient staffed with attending, Dr. Helene Kelp.     Baruch Merl  Internal Medicine, PGY-1       66yr male with history of GERD, OSA on CPAP, asthma, and anxiety presenting for worsening anxiety.      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 Endorses internal "fight or flight" response, upset stomach, and tension in shoulders (though latter is at baseline). Occasionally has a "heat flush" feeling over past 2 weeks, but not very often; has never experienced this before. No shortness of breath or chest pain, trouble sleeping, changes in appetite, depressed mood, or loss of pleasure in activities. Denies suicidality/homicidality and feels generally happy. Has been  doing meditation and improving his diet/exercise regimen.    Patient notes that he is interested in medication. Already doing marital counseling and might participate in individual sessions with this counselor, as well. Tried SSRI in 2011 (citalopram per chart review), felt like it  was modestly beneficial. Stopped the medication due to improved anxiety a few years ago. Anxiety has since been controlled with meditation, diet, and exercise; patient did have episode of worsening anxiety in 2020, but this resolved after optimization of diet/exercise and meditation.     ROS:  See HPI above for pertinent positives/negatives. All other systems were reviewed and are negative, except as listed above in the HPI.      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 8.5 g 2    Fluticasone (FLOVENT HFA) 110 mcg/actuation Inhaler Take 1 puff by inhalation every 12 hours. 36 g 1     No current facility-administered medications on file prior to visit.       Social:  Social History     Socioeconomic History    Marital status: MARRIED     Spouse name: Not on file    Number of children: 2    Years of education: Not on file    Highest education level: Not on file   Occupational History    Not on file   Tobacco Use    Smoking status: Never Smoker    Smokeless tobacco: Never Used   Substance and Sexual Activity    Alcohol use: Yes     Alcohol/week: 1.0 standard drink     Types: 1 Glasses of wine per week    Drug use: No    Sexual activity: Yes     Partners: Female   Other Topics Concern    Not on file   Social History Narrative     pmh - asthma , rectal bleeding, infertility ( couple ) ,rt shoulder pain , low testosterone ,gerd     rosacea , lumbago       surgery - vasectomy       meds-  see med list       allergies none       habits- non smoking , occ etoh , not exercising        sh- married  2 childern ,       fam hx - father with asthma , mom has irregular heart beats,     Social Determinants of Health     Financial Resource Strain: Not on file   Food Insecurity: Not on file   Transportation Needs: Not on file   Physical Activity: Not on file   Stress: Not on file   Social Connections: Not on file   Intimate Partner Violence: Not on file   Housing Stability: Not on file       Family:  Family History   Problem Relation Name Age of Onset    Asthma Father  Heart Mother          arrythmia           Physical Exam:   Temp: 36.4 C (97.6 F) (01/28 1316)  Temp src: Temporal (01/28 1316)  Pulse: 71 (01/28 1353)  BP: 150/88 (01/28 1353)  Resp: --  SpO2: 97 % (01/28 1316)  Height: 177.8 cm (5\' 10" ) (01/28 1316)  Weight: 102.1 kg (225 lb 1.4 oz) (01/28 1316)    General Appearance: sitting comfortably in chair, pleasant, well-dressed, no acute distress  HEENT: normocephalic and atraumatic, pupils equal round and reactive to light, EOM intact, no thyromegaly.  Heart:  regular rate and rhythm with no murmurs, clicks, or gallops.  Lungs: clear to auscultation bilaterally  Abdomen: abdomen soft, non-tender, non-distended. BS normal. No masses or organomegaly.  Extremities:  no edema, dorsalis pedis pulses 2+ bilaterally.  Psych: normal mood and affect        Labs/Tests/Imaging:   Lab Results   Lab Name Value Date/Time    NA 139 05/01/2019 07:50 AM    NA 142 04/15/2012 09:05 AM    K 3.9 05/01/2019 07:50 AM    K 4.5 04/15/2012 09:05 AM    CL 104 05/01/2019 07:50 AM    CL 107 04/15/2012 09:05 AM    CO2 25 05/01/2019 07:50 AM    CO2 27 04/15/2012 09:05 AM    BUN 17 05/01/2019 07:50 AM    BUN 18 04/15/2012 09:05 AM    CR 1.24 05/01/2019 07:50 AM    CR 0.87 04/15/2012 09:05 AM    GLU 89 05/01/2019 07:50 AM    GLU 86 04/15/2012 09:05 AM     Lab Results   Lab Name Value Date/Time    WBC 5.7 05/01/2019 07:50 AM    WBC 5.7 04/15/2012 09:05 AM    HGB 14.5 05/01/2019 07:50 AM    HGB 14.8 04/15/2012 09:05  AM    HCT 43.1 05/01/2019 07:50 AM    HCT 44.1 04/15/2012 09:05 AM    PLT 193 05/01/2019 07:50 AM    PLT 168 04/15/2012 09:05 AM     .labbrief[tsh,ft4   Lab Results   Lab Name Value Date/Time    AST 20 05/01/2019 07:50 AM    AST 24 04/15/2012 09:05 AM    ALT 21 05/01/2019 07:50 AM    ALT 24 04/15/2012 09:05 AM    ALP 66 05/01/2019 07:50 AM    ALP 63 04/15/2012 09:05 AM    ALB 3.9 05/01/2019 07:50 AM    ALB 4.0 04/15/2012 09:05 AM    TP 6.6 05/01/2019 07:50 AM    TP 6.8 04/15/2012 09:05 AM    TBIL 0.6 05/01/2019 07:50 AM    TBIL 0.4 04/15/2012 09:05 AM     Lab Results   Lab Name Value Date/Time    CHOL 173 05/01/2019 07:50 AM    CHOL 182 04/15/2012 09:05 AM    LDLC 109 05/01/2019 07:50 AM    LDLC 120 04/15/2012 09:05 AM    HDL 53 05/01/2019 07:50 AM    HDL 49 04/15/2012 09:05 AM    TRIG 54 05/01/2019 07:50 AM    TRIG 66 04/15/2012 09:05 AM     Lab Results   Lab Name Value Date/Time    HGBA1C 5.6 05/01/2019 07:50 AM    HGBA1C 5.5 11/11/2007 09:32 AM    LDLC 109 05/01/2019 07:50 AM    LDLC 120 04/15/2012 09:05 AM    LDLC 111 05/04/2010 08:45 AM  LDLC 122 05/17/2009 10:00 AM    CR 1.24 05/01/2019 07:50 AM    CR 0.87 04/15/2012 09:05 AM    CR 1.03 05/04/2010 08:45 AM    CR 0.99 11/15/2009 09:46 AM          Assessment/Plan:  Assessment and plan has been moved to the top of the note for clarity and ease of reading.     EDUCATION:  I educated/instructed the patient or caregiver regarding all aspects of the above stated plan of care.  The patient or caregiver indicated understanding.       [place] interpreter was not used.

## 2020-07-14 ENCOUNTER — Encounter: Payer: Self-pay | Admitting: Student in an Organized Health Care Education/Training Program

## 2020-07-14 ENCOUNTER — Ambulatory Visit: Payer: Commercial Managed Care - PPO

## 2020-07-14 ENCOUNTER — Ambulatory Visit
Payer: Commercial Managed Care - PPO | Attending: Internal Medicine | Admitting: Student in an Organized Health Care Education/Training Program

## 2020-07-14 VITALS — BP 150/88 | HR 71 | Temp 97.6°F | Ht 70.0 in | Wt 225.1 lb

## 2020-07-14 DIAGNOSIS — F419 Anxiety disorder, unspecified: Secondary | ICD-10-CM | POA: Insufficient documentation

## 2020-07-14 DIAGNOSIS — R232 Flushing: Secondary | ICD-10-CM

## 2020-07-14 DIAGNOSIS — Z0000 Encounter for general adult medical examination without abnormal findings: Secondary | ICD-10-CM

## 2020-07-14 DIAGNOSIS — Z79899 Other long term (current) drug therapy: Secondary | ICD-10-CM

## 2020-07-14 DIAGNOSIS — R030 Elevated blood-pressure reading, without diagnosis of hypertension: Secondary | ICD-10-CM

## 2020-07-14 DIAGNOSIS — Z7951 Long term (current) use of inhaled steroids: Secondary | ICD-10-CM

## 2020-07-14 DIAGNOSIS — J45909 Unspecified asthma, uncomplicated: Secondary | ICD-10-CM

## 2020-07-14 LAB — CBC WITH DIFFERENTIAL
Basophils % Auto: 0.7 %
Basophils Abs Auto: 0.1 10*3/uL (ref 0.0–0.2)
Eosinophils % Auto: 0.6 %
Eosinophils Abs Auto: 0 10*3/uL (ref 0.0–0.5)
Hematocrit: 47.5 % (ref 41.0–53.0)
Hemoglobin: 15.5 g/dL (ref 13.5–17.5)
Lymphocytes % Auto: 22.3 %
Lymphocytes Abs Auto: 1.7 10*3/uL (ref 1.0–4.8)
MCH: 29.3 pg (ref 27.0–33.0)
MCHC: 32.6 % (ref 32.0–36.0)
MCV: 89.8 fL (ref 80.0–100.0)
MPV: 10.3 fL — ABNORMAL HIGH (ref 6.8–10.0)
Monocytes % Auto: 8.3 %
Monocytes Abs Auto: 0.6 10*3/uL (ref 0.1–0.8)
Neutrophils % Auto: 68.1 %
Neutrophils Abs Auto: 5.1 10*3/uL (ref 1.8–7.7)
Platelet Count: 199 10*3/uL (ref 130–400)
RDW: 12.5 % (ref 0.0–14.7)
Red Blood Cell Count: 5.29 10*6/uL (ref 4.50–5.90)
White Blood Cell Count: 7.5 10*3/uL (ref 4.5–11.0)

## 2020-07-14 LAB — LIPID PANEL WITH DLDL REFLEX
Cholesterol: 188 mg/dL (ref 0–200)
HDL Cholesterol: 51 mg/dL (ref >=35)
LDL Cholesterol Calculation: 117 mg/dL (ref <130)
Non-HDL Cholesterol: 137 mg/dL (ref <=160)
Total Cholesterol: HDL Ratio: 3.7 (ref <4.0)
Triglyceride: 100 mg/dL (ref 35–160)

## 2020-07-14 LAB — COMPREHENSIVE METABOLIC PANEL
Alanine Transferase (ALT): 20 U/L (ref 6–63)
Albumin: 4.3 g/dL (ref 3.4–4.8)
Alkaline Phosphatase (ALP): 71 U/L (ref 35–115)
Aspartate Transaminase (AST): 24 U/L (ref 15–43)
Bilirubin Total: 0.8 mg/dL (ref 0.3–1.3)
Calcium: 9.7 mg/dL (ref 8.6–10.5)
Carbon Dioxide Total: 24 mmol/L (ref 24–32)
Chloride: 101 mmol/L (ref 95–110)
Creatinine Serum: 1.24 mg/dL (ref 0.44–1.27)
E-GFR Creatinine (Male): 66 mL/min/{1.73_m2}
Glucose: 88 mg/dL (ref 70–99)
Potassium: 4 mmol/L (ref 3.3–5.0)
Protein: 7.1 g/dL (ref 6.3–8.3)
Sodium: 139 mmol/L (ref 135–145)
Urea Nitrogen, Blood (BUN): 11 mg/dL (ref 8–22)

## 2020-07-14 LAB — TSH WITH FREE T4 REFLEX: Thyroid Stimulating Hormone: 1.45 u[IU]/mL (ref 0.35–3.30)

## 2020-07-14 MED ORDER — SERTRALINE 25 MG TABLET: 25.0000 mg | ORAL_TABLET | Freq: Every day | ORAL | 3 refills | Status: DC

## 2020-07-14 NOTE — Nursing Note (Signed)
Patient WAS wearing a surgical mask  Droplet precautions were followed when caring for the patient.   PPE used by provider during encounter: Surgical mask   Fredrick Geoghegan, RN

## 2020-07-14 NOTE — Progress Notes (Signed)
This patient was seen, evaluated, and care plan was developed with the resident.  I agree with the assessment and plan as outlined in the resident's note.      Electronically signed by:  Maxwell Lemen, M.D.  Attending, Internal Medicine  Pager: [phone]

## 2020-07-14 NOTE — Patient Instructions (Signed)
1. Please start taking sertraline 25 mg per day. Send a message or call clinic if you are not feeling a benefit from the medication in 6 weeks or so.  2. Check labs upstairs. We have ordered a thyroid test, blood count, chemistries, and cholesterol labs for you

## 2020-07-17 ENCOUNTER — Encounter: Payer: Self-pay | Admitting: Student in an Organized Health Care Education/Training Program

## 2020-07-17 NOTE — Telephone Encounter (Signed)
From: Dara Lords  To: Baruch Merl, MD  Sent: 07/15/2020 12:05 PM PST  Subject: Question regarding CBC WITH AUTO DIFFERENTIAL    High MPV? Just above normal. Concern or watch?

## 2020-07-19 ENCOUNTER — Encounter: Payer: Self-pay | Admitting: INTERNAL MEDICINE

## 2020-07-19 DIAGNOSIS — J45909 Unspecified asthma, uncomplicated: Secondary | ICD-10-CM

## 2020-07-19 MED ORDER — ALBUTEROL SULFATE HFA 90 MCG/ACTUATION AEROSOL INHALER: 2.0000 | INHALATION_SPRAY | RESPIRATORY_TRACT | 2 refills | Status: DC | PRN

## 2020-07-19 NOTE — Telephone Encounter (Signed)
Provider: Baruch Merl, MD  Next appointment: none   Last appointment: 07/14/20   Last prescribed date per EMR:   Medication Name albuterol Date 08/31/19   Med last filled: not provided  Med last reviewed/reconciled: 07/14/20   Labs (if required by protocol): N/A    Meets CCPS P&T Approved Protocol: yes  ========================================  Refill authorized per P&T Protocol 07/19/2020

## 2020-10-08 ENCOUNTER — Encounter: Payer: Self-pay | Admitting: Student in an Organized Health Care Education/Training Program

## 2020-10-08 DIAGNOSIS — F419 Anxiety disorder, unspecified: Secondary | ICD-10-CM

## 2020-10-09 NOTE — Telephone Encounter (Signed)
Provider: Otilio Jefferson, MD    Prescription for Sertraline (ZOLOFT) 25 mg Tablet sent on 07/14/20 for 1 year supply.     Closing encounter.

## 2020-11-17 ENCOUNTER — Ambulatory Visit: Payer: Commercial Managed Care - PPO

## 2020-11-17 DIAGNOSIS — Z23 Encounter for immunization: Secondary | ICD-10-CM

## 2020-11-17 NOTE — Progress Notes (Signed)
Patient WAS wearing a surgical mask  Droplet precautions were followed when caring for the patient.   PPE used by provider during encounter: Surgical mask and Gloves    COVID-19 Vaccine Documentation:  The patient is eligible to receive the COVID-19 Vaccine at this time: Yes    The patient acknowledges receipt of the Fact Sheet for Recipients and Caregivers, which includes information about the risks and benefits of the vaccine and available alternatives, for the following vaccine manufacturer: Pfizer-BioNtech     The patient or person named in permission was informed the FDA has authorized the [employer] [place]) COVID-19 Vaccine as a 2-dose primary series for ages 39 and older; that the FDA has authorized the emergency use of the [place] COVID-19 Vaccine as an additional primary series dose for immunocompromised individuals and as a booster dose given after the completion of the primary series for ages 34 and older, which is not FDA-approved; and that the patient may accept or refuse the vaccine.     Has the patient received a COVID vaccine previously: Yes   Immunization History   Administered Date(s) Administered          COVID-19 mRNA PF 30 mcg/0.3 mL (Pfizer) (PURPLE CAP)  COVID-19 mRNA PF 30 mcg/0.3 mL (Pfizer) (PURPLE CAP)  COVID-19 mRNA PF 30 mcg/0.3 mL [employer]) (PURPLE CAP)   09/16/2019   10/09/2019  05/19/2020          Pre-Screening/Eligibility Questions:      1. Which Dose of the COVID-19 Vaccine is the patient receiving? Booster Dose   Is this the patient's 1st or 2nd Booster Dose? 2nd Booster Dose  Confirmed Patient Eligibility Reason for 2nd Booster Dose: Patient is 54YO+; Patient is Immunocompromised  Confirmed the patient's last dose was administered at least 4 months ago? Yes    2. Does the patient have any absolute contraindications for receiving the COVID-19 vaccine? ---- No Contraindications ----     3. Does the patient have any reason to defer the vaccine? ------ No Deferrals -----      4.  Has the patient received a dermal filler injection? No    5. Does patient have any condition that warrants a longer post-vaccine observation period (30 minutes)? --- No conditions to warrant longer observation ---     Don't see the pre-screening responses? Please place the vaccine order and answer the order questions prior to creating your COVID Vaccine Note.        All patient questions were answered and the patient agrees to receive the COVID-19 vaccine today.   Vaccine prepared and administered according to the current Emergency Use Authorization and Fact Sheet for Healthcare Providers Administering Vaccine.  Patient given vaccine card and discharge instructions with information about the Fact Sheet for Recipients and Caregivers and the v-safe program.      COVID-19 Vaccine Observation:  The patient was observed for 15 minutes for immediate reactions to the vaccine per protocol. No reaction was observed.    Karle Starch de [place], [place]

## 2021-04-04 ENCOUNTER — Ambulatory Visit: Payer: Commercial Managed Care - PPO

## 2021-04-04 DIAGNOSIS — Z23 Encounter for immunization: Secondary | ICD-10-CM

## 2021-04-04 NOTE — Nursing Note (Signed)
The patient has received a flu vaccine previously: yes    The Inactivated Influenza Vaccine VIS document for the flu injection was given to patient to review. All questions were referred to the physician.    The Screening Checklist for Contraindications to Inactivated Injectable Influenza Vaccination was completed:  Is the patient sick today?  Does the patient have an allergy to a component of the vaccine?  Has the patient ever had a serious reaction to the influenza vaccine in the past?  Has the patient ever had Guillain-Barre syndrome?  Did the patient answer "Yes" to any of the screening questions? No - The Influenza Vaccine was administered per Policy 2041 using the standing order.     The patient was observed for 15 minutes for immediate reactions to the vaccine per protocol. No reaction was observed.      Ilya Neely, MA

## 2021-04-04 NOTE — Nursing Note (Signed)
Patient verified by two sources: Name and DOB.  Vital signs taken, allergies verified, screened for pain, tobacco Hx verified. Pharmacy confirmed.      Patient WAS wearing a surgical mask  Standard, eye and mask precautions were followed when caring for the patient.   PPE used by provider during encounter: Surgical mask, face shield and Gloves    Luwanda Starr, MA

## 2021-04-26 ENCOUNTER — Ambulatory Visit: Payer: Commercial Managed Care - PPO

## 2021-05-05 ENCOUNTER — Ambulatory Visit: Payer: Commercial Managed Care - PPO | Attending: Family Medicine

## 2021-05-05 DIAGNOSIS — Z23 Encounter for immunization: Secondary | ICD-10-CM | POA: Insufficient documentation

## 2021-05-05 NOTE — Nursing Note (Signed)
Patient WAS wearing a surgical mask  Contact precautions were followed when caring for the patient.   PPE used by provider during encounter: Surgical mask      Influenza Vaccine Documentation:  The patient is receiving their Influenza Vaccine today: No    COVID-19 Vaccine Documentation:  The patient is eligible to receive the COVID-19 Vaccine at this time: Yes    The patient acknowledges receipt of the Fact Sheet for Recipients and Caregivers, which includes information about the risks and benefits of the vaccine and available alternatives, for the following vaccine manufacturer: Pfizer-BioNTech     The patient or person named in permission was informed the FDA has approved the [place] ([place]) COVID-19 Vaccine as a 2-dose primary series for ages 29 and older; that the FDA has authorized the emergency use of the [place] COVID-19 Vaccine as an additional primary series dose for immunocompromised individuals and the emergency use of the [place] COVID-19 Bivalent Vaccine as a booster dose to be given after the completion of the primary series for ages 66 years and older, which are not FDA-approved doses; and that the patient may accept or refuse the vaccine.     Has the patient received a COVID vaccine previously: Yes   Immunization History   Administered Date(s) Administered          COVID-19 mRNA PF 30 mcg/0.3 mL (Pfizer) (PURPLE CAP)  COVID-19 mRNA PF 30 mcg/0.3 mL (Pfizer) (PURPLE CAP)  COVID-19 mRNA PF 30 mcg/0.3 mL (Pfizer) (PURPLE CAP)  COVID-19 mRNA PF 30 mcg/0.3 mL Tris Sucrose ([place]) (GRAY CAP) (12 yrs and older) 09/16/2019   10/09/2019  05/19/2020  11/17/2020        Pre-Screening/Eligibility Questions:       1. Which Dose of the COVID-19 Vaccine is the patient receiving? Booster Dose    2. Does the patient have any absolute contraindications for receiving the COVID-19 vaccine? ---- No Contraindications ----     3. Does the patient have any reason to defer the vaccine? ------ No Deferrals -----        4.  Does patient have any condition that warrants a longer post-vaccine observation period (30 minutes)? --- No conditions to warrant longer observation ---     Don't see the pre-screening responses? Please place the vaccine order and answer the order questions prior to creating your COVID Vaccine Note.        All patient questions were answered and the patient agrees to receive the COVID-19 vaccine today.   Vaccine prepared and administered according to the current Emergency Use Authorization and Fact Sheet for Healthcare Providers Administering Vaccine.  Vaccine formulation and dose an patient's age and order verified by Nunzio Cory, [place] prior to administration.  Patient given vaccine card and discharge instructions with information about the Fact Sheet for Recipients and Caregivers and the v-safe program.      COVID-19 Vaccine Observation:  The patient was observed for 15 minutes for immediate reactions to the vaccine per protocol. No reaction was observed.    Barnetta Chapel, [place]

## 2021-07-02 ENCOUNTER — Other Ambulatory Visit: Payer: Self-pay | Admitting: Student in an Organized Health Care Education/Training Program

## 2021-07-02 DIAGNOSIS — F419 Anxiety disorder, unspecified: Secondary | ICD-10-CM

## 2021-07-04 NOTE — Telephone Encounter (Signed)
Refill authorized per P&T Protocol 07/04/2021    ========================================    Medication name: SERTRALINE  Meets Epic Protocol: Yes, as determined by Epic (See Protocol Details for specific information)

## 2021-08-28 ENCOUNTER — Other Ambulatory Visit: Payer: Self-pay | Admitting: Internal Medicine

## 2021-08-28 DIAGNOSIS — J45909 Unspecified asthma, uncomplicated: Secondary | ICD-10-CM

## 2021-08-30 ENCOUNTER — Telehealth: Payer: Self-pay | Admitting: Student in an Organized Health Care Education/Training Program

## 2021-08-30 NOTE — Telephone Encounter (Signed)
I have attempted without success to contact this patient by phone. A message was left to call the clinic back at [phone]. To schedule next appointment with Dr Harada  .           Kwaku Mostafa  [facility]  Internal Medicine and Specialty Clinics  Clinic line: [phone]  Fax: [phone]

## 2021-08-30 NOTE — Telephone Encounter (Signed)
[place] Team    Last visit is greater than 12 months, but within 18 months.   Transition fill ordered per protocol.     Encounter will be routed to clinic staff to assist scheduling an appointment.    Future refills will be deferred to the provider if the patient does not schedule and attend an appointment.    Refill authorized per P&T Protocol 08/30/2021     ====================================================================    Provider: Baruch Merl, MD  Next appointment: none   Last appointment: 07/14/20   Medication Name:   Requested Prescriptions     Pending Prescriptions Disp Refills    Albuterol (PROAIR HFA, PROVENTIL HFA, VENTOLIN HFA) 90 mcg/actuation inhaler [Pharmacy Med Name: ALBUTEROL HFA (PROAIR) INHALER] 8.5 each 2     Sig: TAKE 2 PUFFS BY INHALATION EVERY 4 HOURS IF NEEDED.     Last prescribed date per EMR: 07/19/20   Med last filled: 04/15/21   Med last reviewed/reconciled: 07/13/20  Labs (if required by protocol): N/A

## 2021-10-06 ENCOUNTER — Other Ambulatory Visit: Payer: Self-pay | Admitting: Student in an Organized Health Care Education/Training Program

## 2021-10-06 DIAGNOSIS — F419 Anxiety disorder, unspecified: Secondary | ICD-10-CM

## 2021-10-08 NOTE — Telephone Encounter (Signed)
[place] Team    Per [facility] Protocol, approval or denial will be determined by the provider     .   Meets Epic Protocol: NO , as determined by Epic (See Protocol Details for specific information)   No visits with relevant provider in past 12 months   Last office visit 07/14/20  ====================================================================    Medication name: SERTRALINE  Labs (if required by protocol):   Lab Results   Component Value Date    NA 139 07/14/2020    NA 139 05/01/2019    K 4.0 07/14/2020    K 3.9 05/01/2019    CA 9.7 07/14/2020    CA 9.0 05/01/2019    CR 1.24 07/14/2020    CR 1.24 05/01/2019    GFRNAA 66 07/14/2020    GFRNAA 66 05/01/2019    BUN 11 07/14/2020    BUN 17 05/01/2019

## 2022-01-05 ENCOUNTER — Other Ambulatory Visit: Payer: Self-pay | Admitting: Student in an Organized Health Care Education/Training Program

## 2022-01-05 DIAGNOSIS — F419 Anxiety disorder, unspecified: Secondary | ICD-10-CM

## 2022-01-07 NOTE — Telephone Encounter (Signed)
[facility]    Per [facility] Protocol, approval or denial will be determined by the provider     .   Meets Epic Protocol: NO , as determined by Epic (See Protocol Details for specific information)   No visits with relevant provider in past 12 months     ====================================================================    Medication name: SERTRALINE  Labs (if required by protocol): N/A

## 2022-01-13 ENCOUNTER — Encounter: Payer: Self-pay | Admitting: INTERNAL MEDICINE

## 2022-01-13 ENCOUNTER — Encounter: Payer: Self-pay | Admitting: Student in an Organized Health Care Education/Training Program

## 2022-01-13 DIAGNOSIS — J45909 Unspecified asthma, uncomplicated: Secondary | ICD-10-CM

## 2022-01-13 DIAGNOSIS — F419 Anxiety disorder, unspecified: Secondary | ICD-10-CM

## 2022-01-14 NOTE — Telephone Encounter (Signed)
[place] Team    Per CCPS Protocol, approval or denial will be determined by the provider   .   Meets Epic Protocol: NO , as determined by Epic (See Protocol Details for specific information)   No visits with relevant provider in past 12 months   Last OV 07/14/20  ====================================================================    Medication name: [place] (if required by protocol): N/A

## 2022-01-14 NOTE — Telephone Encounter (Signed)
[place] Team    DUPLICATE REFILL REQUEST     Prescription renewal addressed on 01/12/22, sent to [facility] [phone]    Refill Request denied as DUPLICATE per CCPS Workflow 01/14/2022    Patient informed via [place].

## 2022-01-16 MED ORDER — FLUTICASONE 110 MCG/ACTUATION AEROSOL ORAL INHALER
1.0000 | INHALATION_SPRAY | Freq: Two times a day (BID) | RESPIRATORY_TRACT | 1 refills | Status: AC
Start: 2022-01-16 — End: 2023-01-17

## 2022-02-08 ENCOUNTER — Encounter: Payer: Self-pay | Admitting: Student in an Organized Health Care Education/Training Program

## 2022-02-08 ENCOUNTER — Ambulatory Visit: Payer: Commercial Managed Care - PPO

## 2022-02-08 DIAGNOSIS — Z23 Encounter for immunization: Secondary | ICD-10-CM

## 2022-02-08 NOTE — Telephone Encounter (Signed)
From: Dara Lords  To: Baruch Merl, MD  Sent: 02/08/2022 4:07 PM PDT  Subject: COVID booster     I scheduled a booster 3 weeks ago. Just told I need a referral. I am 56 and have asthma. November 2022 was my last booster. Would like to get one now before traveling the month of September and make space before my flu shot.

## 2022-02-09 NOTE — Addendum Note (Signed)
Addended by: Baruch Merl on: 02/09/2022 10:11 AM     Modules accepted: Orders

## 2022-02-15 ENCOUNTER — Other Ambulatory Visit: Payer: Self-pay | Admitting: Student in an Organized Health Care Education/Training Program

## 2022-02-15 DIAGNOSIS — J45909 Unspecified asthma, uncomplicated: Secondary | ICD-10-CM

## 2022-02-19 ENCOUNTER — Other Ambulatory Visit: Payer: Self-pay

## 2022-02-19 NOTE — Telephone Encounter (Signed)
Flovent HFA is not covered by the patient's insurance: [employer]     The following medications are preferred alternatives:  Pulmicort Flexhaler ($15 test claim)  Alvesco ($70 test lcaim)      If the preferred alternative is appropriate for this patient, please send a new prescription to their pharmacy.    If the preferred alternative is not appropriate for this patient, please document the medical justification why, and we will submit PA for original medication ordered.    Please contact [facility] (Refill / PA Team) if you have questions regarding formulary alternatives or for clarification.    Letitia Caul, Pharm [employer]

## 2022-02-20 MED ORDER — PULMICORT FLEXHALER 90 MCG/ACTUATION BREATH ACTIVATED
1.0000 | INHALATION_SPRAY | Freq: Two times a day (BID) | RESPIRATORY_TRACT | 3 refills | Status: AC
Start: 2022-02-20 — End: 2023-02-15

## 2022-02-20 NOTE — Telephone Encounter (Signed)
Patient calling in regards to needing a refill for the following medication LevETIRAcetam (KEPPRA) 1,000 mg Tablet please advise ?

## 2022-02-21 ENCOUNTER — Other Ambulatory Visit: Payer: Self-pay | Admitting: Student in an Organized Health Care Education/Training Program

## 2022-02-25 ENCOUNTER — Other Ambulatory Visit: Payer: Self-pay

## 2022-02-25 MED ORDER — FLUTICASONE-SALMETEROL 250 MCG-50 MCG/DOSE DISK POWDER FOR ORAL INHALATION: 1.0000 | DISK | Freq: Two times a day (BID) | RESPIRATORY_TRACT | 12 refills | Status: DC

## 2022-02-25 NOTE — Telephone Encounter (Signed)
PULMICORT FLEXHALER is not available at patient's pharmacy.     The following medications are preferred alternatives:  Alvesco HFA $70 Copay  Advair Diskus $0 Copay    If the preferred alternative is appropriate for this patient, please send a new prescription to their pharmacy.      If the preferred alternative is not appropriate for this patient, please document the medical justification why, and we will submit PA for original medication ordered.    Please contact [facility] (Refill / PA Team) if you have questions regarding formulary alternatives or for clarification.    Mal Amabile, Pharm Tech

## 2022-04-01 ENCOUNTER — Encounter: Payer: Self-pay | Admitting: Student in an Organized Health Care Education/Training Program

## 2022-04-01 DIAGNOSIS — Z1211 Encounter for screening for malignant neoplasm of colon: Secondary | ICD-10-CM

## 2022-04-01 NOTE — Telephone Encounter (Signed)
From: Dara Lords  To: Baruch Merl, MD  Sent: 03/31/2022 2:35 PM PDT  Subject: I need to schedule a Colonoscopy.    Do I need to schedule a general check-up before getting a referral?

## 2022-04-04 NOTE — Addendum Note (Signed)
Addended by: Baruch Merl on: 04/04/2022 03:56 PM     Modules accepted: Orders

## 2022-04-15 ENCOUNTER — Telehealth: Payer: Self-pay | Admitting: Student in an Organized Health Care Education/Training Program

## 2022-04-15 ENCOUNTER — Other Ambulatory Visit: Payer: Self-pay | Admitting: Student in an Organized Health Care Education/Training Program

## 2022-04-15 DIAGNOSIS — F419 Anxiety disorder, unspecified: Secondary | ICD-10-CM

## 2022-04-15 NOTE — Telephone Encounter (Signed)
I have attempted without success to contact this patient by phone. A message was left to call the clinic back at [phone]. To schedule next appointment with Dr Otilio Jefferson  .           Matthew Folks  [facility]  Internal Medicine and [place] [facility] line: [phone]  Fax: [phone]

## 2022-04-15 NOTE — Telephone Encounter (Signed)
[place] Team    Per CCPS Protocol, approval or denial will be determined by the provider     .   Meets Epic Protocol: NO , as determined by Epic (See Protocol Details for specific information)   No visits with relevant provider in past 12 months     ====================================================================    Medication name: SERTRALINE  Labs (if required by protocol): N/A

## 2022-04-22 ENCOUNTER — Other Ambulatory Visit: Payer: Self-pay | Admitting: Internal Medicine

## 2022-04-22 DIAGNOSIS — D369 Benign neoplasm, unspecified site: Secondary | ICD-10-CM

## 2022-04-22 DIAGNOSIS — Z1211 Encounter for screening for malignant neoplasm of colon: Secondary | ICD-10-CM

## 2022-05-27 ENCOUNTER — Telehealth: Payer: Self-pay | Admitting: Student in an Organized Health Care Education/Training Program
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
## 2022-05-27 NOTE — Telephone Encounter (Signed)
Patient returned call. We have changed his visit for tomorrow to a video visit.    Alex Werner  Patient Alex Werner

## 2022-05-28 ENCOUNTER — Encounter: Payer: Self-pay | Admitting: Student in an Organized Health Care Education/Training Program

## 2022-05-28 ENCOUNTER — Ambulatory Visit: Payer: Commercial Managed Care - PPO | Admitting: Student in an Organized Health Care Education/Training Program

## 2022-05-28 ENCOUNTER — Telehealth: Payer: Self-pay | Admitting: Student in an Organized Health Care Education/Training Program

## 2022-05-28 DIAGNOSIS — Z0000 Encounter for general adult medical examination without abnormal findings: Secondary | ICD-10-CM

## 2022-05-28 DIAGNOSIS — F419 Anxiety disorder, unspecified: Secondary | ICD-10-CM

## 2022-05-28 DIAGNOSIS — Z125 Encounter for screening for malignant neoplasm of prostate: Secondary | ICD-10-CM

## 2022-05-28 DIAGNOSIS — Z23 Encounter for immunization: Secondary | ICD-10-CM

## 2022-05-28 NOTE — Telephone Encounter (Signed)
Appointments scheduled. [place] message sent to patient.    Kyra Leyland, MA

## 2022-05-28 NOTE — Progress Notes (Signed)
Video Visit Note     I performed this clinical encounter by utilizing a real time telehealth video connection between my location and the patient's location. The patient's location was confirmed during this visit. I obtained verbal consent from the patient to perform this clinical encounter utilizing video and prepared the patient by answering any questions they had about the telehealth interaction.    I spent a total of 30 minutes today on this patient's visit.    ASSESSMENT & PLAN       Prostate cancer screening  Risks/benefits of PSA screening discussed, patient amenable.  -     PSA Screen; Future    Anxiety  Currently well-controlled on sertraline 25 mg. Interested in stopping medication eventually, but notes increased anxiety when he tries to stop this. Educated patient on benign nature of the medication and the low dose  - continue sertraline 25 mg daily    Healthcare maintenance  Patient interested in [place] and flu shot. Will discuss PCV 20 vaccine at next appt  - flu shot, nursing appointment requested  - COVID shot, nursing appointment on a separate day requested  - will f/u on which labs patient received at recent work health fair and order lipid panel/HgA1c/CBC/CMP if not already ordered         Review and Address: Health Maintenance Medication Management    No follow-ups on file.      Total time I spent in care of this patient today (excluding time spent on other billable services) was 30 minutes.     SUBJECTIVE      Alex Werner is a 56yrold male with GERD, OSA on CPAP, asthma, and anxiety. He had concerns including follow-up     Anxiety  - anxiety is well-controlled on sertraline 25 mg. Has tried to stop in the past, but notes that he felt more anxious after stopping it.    Asthma - good, currently on advair     BP   - BP 120/80 at home, is elevated during office visits (notes that he gets anxious during these periods)    Lifestyle   - BMI high      - diet: less sugar, exercise more regularly  including weights at the gym. Walks daily, gym       - not interested in nutritionist     Derm  - Minoxidil 0.5 tablet for hair growth, prescribed by his dermatologist. Planning to increase dose to 1 tablet daily. Patient will continue to check BP at home while on the medication  - Rosacea treatment - topical cream  - Dermatologist removed cyst from arm  TIP  CC cannot be listed as follow-up or established care. CC must have a symptom or condition documented:15640        OBJECTIVE      His vitals were not taken for this visit.     Physical Exam:   Unable to examine, telehealth visit   Gen: sitting comfortably, no acute distress  Resp: no tachypnea    Relevant Results   07/14/20  Lipid panel  Cholesterol 188  HDL 51   LDL 117     CBC wnl, CMP wnl, TSH wnl      Alex Merl MD  GENERAL MEDICINE, [place]  Internal Medicine, PGY3     Answers submitted by the patient for this visit:  Patient Health Questionnaire (Submitted on 05/27/2022)  PHQ-2 Score:: 0

## 2022-05-29 NOTE — Progress Notes (Signed)
Internal Medicine Attending Addendum:    I did not personally see this patient, but I reviewed and discussed the patient with the Resident and agree with the Resident's findings and plan we developed, as outlined in the resident's note.     By signing this note I am attesting that the Internal Medicine Residency Program at [facility] meets the requirements to bill for services under the Centers for Medicaid and Medicare Services (CMS) Primary Care Exception. An official signed attestation is on file and available upon request.    Total time I spent in care of this patient today (excluding time spent on other billable services) was 10 minutes.    Deshia Vanderhoof, MD  Assistant Clinical Professor, Internal Medicine  Pager [phone]

## 2022-05-31 ENCOUNTER — Ambulatory Visit: Payer: Commercial Managed Care - PPO | Attending: Internal Medicine

## 2022-05-31 DIAGNOSIS — Z23 Encounter for immunization: Secondary | ICD-10-CM | POA: Insufficient documentation

## 2022-05-31 NOTE — Nursing Note (Signed)
The Inactivated Influenza Vaccine VIS document for the Influenza Vaccine was given to the patient/caregiver or legal representative to review. All questions were answered or referred to the physician.    The patient has no contraindications to receive the Influenza Vaccine.The Influenza Vaccine was administered per Policy 504.     The patient was observed for 5 minutes for immediate reactions to the vaccine per protocol. No reaction was observed.      Atalaya Zappia, LVN

## 2022-06-02 ENCOUNTER — Other Ambulatory Visit: Payer: Self-pay | Admitting: Student in an Organized Health Care Education/Training Program

## 2022-06-02 DIAGNOSIS — J45909 Unspecified asthma, uncomplicated: Secondary | ICD-10-CM

## 2022-06-02 NOTE — Telephone Encounter (Signed)
[place] Team    Refill authorized per P&T Protocol 06/02/2022    .   Meets Epic Protocol: YES, as determined by Epic (See Protocol Details for specific information)      ====================================================================    Medication name: albuterol inhaler  Labs (if required by protocol):   Lab Results   Component Value Date    HGBA1C 5.6 05/01/2019    A1CEAG 114 05/01/2019    and N/A

## 2022-06-05 ENCOUNTER — Ambulatory Visit: Payer: Commercial Managed Care - PPO | Attending: Internal Medicine

## 2022-06-05 DIAGNOSIS — Z23 Encounter for immunization: Secondary | ICD-10-CM | POA: Insufficient documentation

## 2022-06-05 NOTE — Nursing Note (Signed)
COVID-19 Vaccine Documentation:  The patient is eligible to receive the COVID-19 Vaccine at this time:Yes    The patient received Moderna. Spikevax Information for Recipients and Caregivers given to the patient/caregiver for review. All patient/caregiver questions were answered and the patient/caregiver consents to the COVID-19 vaccine being given today.      Vaccine prepared and administered according to the Prescribing Information.       COVID-19 Vaccine Observation:  The patient was observed for 15 minutes for immediate reactions to the vaccine per protocol. No reaction was observed.    Letisia Schwalb, LVN

## 2022-08-25 ENCOUNTER — Encounter: Payer: Self-pay | Admitting: Student in an Organized Health Care Education/Training Program

## 2022-08-27 MED ORDER — FLUTICASONE-SALMETEROL 250 MCG-50 MCG/DOSE DISK POWDER FOR ORAL INHALATION: 1.0000 | DISK | Freq: Two times a day (BID) | RESPIRATORY_TRACT | 12 refills | Status: DC

## 2022-08-29 ENCOUNTER — Encounter: Payer: Self-pay | Admitting: Student in an Organized Health Care Education/Training Program

## 2022-08-29 NOTE — Telephone Encounter (Signed)
From: Dara Lords  To: Baruch Merl  Sent: 08/28/2022 7:09 PM PDT  Subject: Change in Pharmacy     I have updated my pharmacy in [place]. Please refill Advir if covered by insurance. If not prescribe alternative covered by insurance. Thank you.

## 2022-08-30 MED ORDER — FLUTICASONE-SALMETEROL 250 MCG-50 MCG/DOSE DISK POWDER FOR ORAL INHALATION: 1.0000 | DISK | Freq: Two times a day (BID) | RESPIRATORY_TRACT | 12 refills | Status: DC

## 2022-08-30 NOTE — Addendum Note (Signed)
Addended by: Dwyane Luo on: 08/30/2022 09:24 AM     Modules accepted: Orders

## 2022-10-30 ENCOUNTER — Ambulatory Visit: Payer: Commercial Managed Care - PPO | Admitting: Student in an Organized Health Care Education/Training Program

## 2022-11-07 ENCOUNTER — Other Ambulatory Visit: Payer: Self-pay | Admitting: GASTROENTEROLOGY

## 2022-11-07 MED ORDER — PEG 3350-ELECTROLYTES 236 GRAM-22.74 GRAM-6.74 GRAM-5.86 GRAM SOLUTION
ORAL | 0 refills | Status: AC
Start: 2022-11-07 — End: 2023-11-07

## 2022-11-17 ENCOUNTER — Encounter: Payer: Self-pay | Admitting: Student in an Organized Health Care Education/Training Program

## 2022-11-17 DIAGNOSIS — Z0000 Encounter for general adult medical examination without abnormal findings: Secondary | ICD-10-CM

## 2022-11-17 NOTE — Progress Notes (Signed)
Internal Medicine Graduating Resident Chart Update  PCP: Orpah Greek, MD    I have met this patient (yes/no): yes    Active and pertinent chronic issues:  GERD  OSA on CPAP  Asthma  White coat HTN  Anxiety - on sertraline    Controlled substance agreement (yes/no): no    Needed follow up from new PCP, urgent issues, other recommendations:  F/u hga1c, lipid panel, CMP, CBC  Continuing sertraline for anxiety (very low dose 25 mg), can consider weaning off if patient interested in the future vs continuing given low dose   - tried stopping before and became more anxious    Outstanding health care maintenance:  PCV 20  Shingrix     Specify time needed for next follow-up appointment: 05/2024    Orpah Greek, MD  Internal Medicine, PGY-3  [facility]

## 2022-12-25 NOTE — Progress Notes (Unsigned)
Gastroenterology RN Scrub NOTE: Date: 12/25/2022, 9:20 AM    Procedure: colonoscopy  Indication: surveillance colon/polyps h/o  Lasts GI procedure/date: colon / 2019   Sedation Given: Benadryl 25 mg, Versed 5 mg, and Fentanyl 100 mcg  Cardiac hx: the patient has no history of an echo/ekg on file.    Respiratory: Sleep study / 2019,  AHI 8    THANK YOU!!  Dyann Kief, RN  Department of Gastroenterology    To the patient:   If you are reviewing this note and have questions about the meaning or medical terms being used, please schedule an appointment or bring it up at your next follow-up appointment.  Medical notes are a communication tool between medical professionals and require medical terms to be used for efficiency. You can also look up terms via on-line medical dictionaries such as Medline Plus at MuscleTreatments.it.html

## 2023-01-07 ENCOUNTER — Ambulatory Visit
Admission: RE | Admit: 2023-01-07 | Discharge: 2023-01-07 | Disposition: A | Discharge status: Home / Self Care | Payer: Commercial Managed Care - PPO | Source: Ambulatory Visit | Attending: Gastroenterology | Admitting: Gastroenterology

## 2023-01-07 DIAGNOSIS — K5730 Diverticulosis of large intestine without perforation or abscess without bleeding: Secondary | ICD-10-CM | POA: Insufficient documentation

## 2023-01-07 DIAGNOSIS — D369 Benign neoplasm, unspecified site: Secondary | ICD-10-CM | POA: Insufficient documentation

## 2023-01-07 DIAGNOSIS — Z1211 Encounter for screening for malignant neoplasm of colon: Secondary | ICD-10-CM | POA: Insufficient documentation

## 2023-01-07 MED ORDER — LIDOCAINE HCL 2 % MUCOSAL SOLUTION
5.0000 mL | Freq: Once | Status: DC
Start: 2023-01-07 — End: 2023-01-13

## 2023-01-07 MED ORDER — MIDAZOLAM 1 MG/ML INJECTION SOLUTION
0.5000 mg | INTRAMUSCULAR | Status: DC | PRN
Start: 2023-01-07 — End: 2023-01-13
  Filled 2023-01-07: qty 10

## 2023-01-07 MED ORDER — FLUMAZENIL 0.1 MG/ML INTRAVENOUS SOLUTION
0.2000 mg | INTRAVENOUS | Status: DC | PRN
Start: 2023-01-07 — End: 2023-01-07

## 2023-01-07 MED ORDER — EPINEPHRINE 0.1 MG/ML INJECTION SYRINGE
1.0000 mg | INJECTION | INTRAMUSCULAR | Status: DC | PRN
Start: 2023-01-07 — End: 2023-01-07

## 2023-01-07 MED ORDER — LACTATED RINGERS IV INFUSION
INTRAVENOUS | Status: DC
Start: 2023-01-07 — End: 2023-01-07

## 2023-01-07 MED ORDER — EPINEPHRINE 0.1 MG/ML INJECTION SYRINGE
0.3000 mg | INJECTION | INTRAMUSCULAR | Status: DC | PRN
Start: 2023-01-07 — End: 2023-01-13

## 2023-01-07 MED ORDER — EPINEPHRINE 0.1 MG/ML INJECTION SYRINGE
1.0000 mg | INJECTION | INTRAMUSCULAR | Status: DC | PRN
Start: 2023-01-07 — End: 2023-01-13

## 2023-01-07 MED ORDER — SIMETHICONE 40 MG/0.6 ML ORAL DROPS,SUSPENSION
40.0000 mg | Freq: Once | ORAL | Status: AC
Start: 2023-01-07 — End: 2023-01-07
  Administered 2023-01-07: 40 mg
  Filled 2023-01-07: qty 0.6

## 2023-01-07 MED ORDER — MIDAZOLAM 1 MG/ML INJECTION SOLUTION
0.5000 mg | INTRAMUSCULAR | Status: DC | PRN
Start: 2023-01-07 — End: 2023-01-13
  Administered 2023-01-07: 4 mg via INTRAVENOUS

## 2023-01-07 MED ORDER — ONDANSETRON HCL (PF) 4 MG/2 ML INJECTION SOLUTION
4.0000 mg | INTRAMUSCULAR | Status: DC | PRN
Start: 2023-01-07 — End: 2023-01-13
  Filled 2023-01-07: qty 2

## 2023-01-07 MED ORDER — DIPHENHYDRAMINE 50 MG/ML INJECTION SOLUTION
12.5000 mg | INTRAMUSCULAR | Status: DC | PRN
Start: 2023-01-07 — End: 2023-01-07

## 2023-01-07 MED ORDER — ATROPINE 0.1 MG/ML INJECTION SYRINGE
0.5000 mg | INJECTION | INTRAMUSCULAR | Status: DC | PRN
Start: 2023-01-07 — End: 2023-01-07

## 2023-01-07 MED ORDER — NALOXONE 0.4 MG/ML INJECTION SOLUTION
0.4000 mg | INTRAMUSCULAR | Status: DC | PRN
Start: 2023-01-07 — End: 2023-01-07

## 2023-01-07 MED ORDER — FENTANYL (PF) 50 MCG/ML INJECTION SOLUTION
12.5000 ug | INTRAMUSCULAR | Status: DC | PRN
Start: 2023-01-07 — End: 2023-01-13
  Administered 2023-01-07: 100 ug via INTRAVENOUS
  Filled 2023-01-07: qty 4

## 2023-01-07 MED ORDER — LACTATED RINGERS IV INFUSION
INTRAVENOUS | Status: DC
Start: 2023-01-07 — End: 2023-01-13

## 2023-01-07 MED ORDER — NALOXONE 0.4 MG/ML INJECTION SOLUTION
0.4000 mg | INTRAMUSCULAR | Status: DC | PRN
Start: 2023-01-07 — End: 2023-01-13

## 2023-01-07 MED ORDER — ONDANSETRON HCL (PF) 4 MG/2 ML INJECTION SOLUTION
4.0000 mg | INTRAMUSCULAR | Status: DC | PRN
Start: 2023-01-07 — End: 2023-01-07

## 2023-01-07 MED ORDER — DIPHENHYDRAMINE 50 MG/ML INJECTION SOLUTION
12.5000 mg | INTRAMUSCULAR | Status: DC | PRN
Start: 2023-01-07 — End: 2023-01-13
  Filled 2023-01-07: qty 1

## 2023-01-07 MED ORDER — FENTANYL (PF) 50 MCG/ML INJECTION SOLUTION
12.5000 ug | INTRAMUSCULAR | Status: DC | PRN
Start: 2023-01-07 — End: 2023-01-07

## 2023-01-07 MED ORDER — FLUMAZENIL 0.1 MG/ML INTRAVENOUS SOLUTION
0.2000 mg | INTRAVENOUS | Status: DC | PRN
Start: 2023-01-07 — End: 2023-01-13

## 2023-01-07 MED ORDER — SIMETHICONE 40 MG/0.6 ML ORAL DROPS,SUSPENSION
40.0000 mg | Freq: Once | ORAL | Status: DC
Start: 2023-01-07 — End: 2023-01-07

## 2023-01-07 MED ORDER — ATROPINE 0.1 MG/ML INJECTION SYRINGE
0.5000 mg | INJECTION | INTRAMUSCULAR | Status: DC | PRN
Start: 2023-01-07 — End: 2023-01-13

## 2023-01-07 MED ORDER — NACL 0.9% IV INFUSION
INTRAVENOUS | Status: DC
Start: 2023-01-07 — End: 2023-01-13

## 2023-01-07 NOTE — Pre Sedation (Signed)
Gastroenterology  Procedural Pre-Sedation  Name:Alex Werner  MRN: [id number]  DOB: 1965-10-18     HPI: Alex Werner is a 57yr old male here for surveillance colonoscopy; prior colo 2019 with two adenomatous polyps removed including SSA; no FH of CRC.     No AC.     No family history of colon cancer.      Past Surgical History:   Procedure Laterality Date    PR COLSC FLX W/RMVL OF TUMOR POLYP LESION SNARE TQ  11/14/2017         VASECTOMY, UNILAT/BILAT, W/POSTOP SEMEN EXAM  12/06/05    Vasectomy        Procedural Planning:   Patient is a candidate for moderate sedation.  Patient is Werner status:  3 - Moderate to severe systemic disease that limits activity but not incapacitating    Airway Assessment:    Mallampati image    Mallampati Class:  2, Oral Eval: Mouth opening normal, Neck ROM:  normal, Thyroid-mentum distance in fingerbreaths: 3    Patient/family understanding:  Verbalizes. The procedure, risks, benefits, and alternatives were explained.  All patient questions were answered.  The informed consent was signed.     Patient barriers to learning: None         Physical Exam     BP (!) 145/75   Pulse 59   Temp 36.4 C (97.5 F)   Resp 12   Ht 1.753 m (5\' 9" )   Wt 104.3 kg (230 lb)   SpO2 96%   BMI 33.97 kg/m     Gen: NAD  CV: regular  Lungs: breathing normally on room air  Abd: soft, NTND, no masses palpated  Ext: warm, no edema    Impression/Plan:  Colonoscopy            Tresa Moore, MD  *PHYSICIAN: FELLOW      Discussed with attending, Dr. Nadeen Landau

## 2023-01-07 NOTE — Pre Sedation (Addendum)
GASTROENTEROLOGY PRE-PROCEDURE HISTORY & PHYSICAL    Date of Service: 01/07/2023    Referring Provider:  PCP: Kate Sable, MD    Procedure:  Colonoscopy    Attending: Delfina Redwood, MD    HPI: Alex Werner is a 57yr old male  with a history of GERD, OBSTRUCTIVE SLEEP APNEA, ON CPAP, ASTHMA, ANXIETY who presents for previous adenomatous polyp    The patient's past medical, family, and social history was reviewed.    IMMEDIATE PRE-SEDATION ASSESSMENT  Pre-Sedation/Anesthesia Assessment:    3 - Moderate to severe systemic disease that limits activity but not incapacitating    Airway Assessment:    Mallampati class 3  ROM normal    Physical Exam:   General Appearance: Healthy, alert, no distress, pleasant affect, cooperative.   Eyes: sclera anicteric.   Heart: normal rate and regular rhythm, no murmurs, clicks, or gallops.   Lungs: clear to auscultation.   Abdomen: BS normal.  Abdomen soft, non-tender.  No masses or organomegaly.   Extremities: no cyanosis, clubbing, or edema.     Impression/Plan:  Colonoscopy    Patient is a candidate for moderate sedation.    The procedure, risks, benefits, and alternatives were explained.  All patient questions were answered.  The informed consent was signed.    Patient barriers to learning:  none    Patient/family understanding:  verbalizes    Deberah Pelton, MD, Associate Clinical Professor

## 2023-01-07 NOTE — Discharge Instructions (Addendum)
[facility] / [facility]    DISCHARGE INSTRUCTIONS FOR GI PROCEDURES    During your procedure today air was pumped into your GI tract so your doctor could see clearly.  This MAY cause bloating, belching and a gassy   feeling.  This will usually pass after you move around and go to the bathroom.  Please pass gas.  You were given sedation during your procedure - TODAY YOU CANNOT DRIVE, OPERATE MACHINERY, DRINK ANY ALCOHOL OR SIGN LEGAL DOCUMENTS for 24 hours.  Please have a responsible adult, 18 years or older, to escort you home.   Severe pain is not expected after this procedure.  Activities:  Rest the day of the procedure.  The day after the procedure, you may do whatever you feel able to do, unless otherwise directed by your physician.   During the procedure, we may have to lift your jaw to help you breathe better so you may experience some soreness afterwards.      COLONOSCOPY/FLEXIBLE SIGMOIDOSCOPY/RECTAL EUS  You may experience a gassy feeling after this procedure - move around, sit on a toilet and/or drink peppermint tea.  It can last for a day or two.  The prep you drank may still be working for a day or two.  You may resume your normal diet upon discharge.    The GI Lab is open Monday-Friday 8:00 am-5:00 PM, our phone number is [phone]    What to watch for: fever >101.5, severe abdominal pain, abdominal distention, chest pain, dizziness, weakness, fast pulse rate, bloody stools or any significant bleeding. If you encounter any of the symptoms mentioned and have questions, PLEASE CALL the GI FELLOW on-call after 5:00PM at [phone] or go to YOUR NEAREST EMERGENCY ROOM. Discharge Instructions for colonoscopy    Findings:  colonoscopy diverticulosis; otherwise normal colon / TI     Activity:    Rest today, resume usual activity tomorrow     Diet:    Resume usual diet:     Medication:    Resume all usual medications    Follow up:   Repeat colonoscopy in 5  years      During your procedure, air was pumped into your GI tract so your doctor could see clearly to make a diagnosis and/or treat your problem.     Some possible side effects you may experience are:   - Discomfort due to a distended (bloated) abdomen which will subside after a few hours to two days.   - Nausea may be a side effect of the medication and will subside.   - The medications you received may make you dizzy and sleepy, it is important that you do not drive, operate machinery or drink alcohol for at least one day.   - Severe pain is not expected and should be reported    Other side effects may include:  Flex. Sig. Or Colonoscopy: A small amount of diarrhea may follow the exam.    If problems, call [facility] at [phone] during business hours Monday through Friday 7:30am to 4:30 pm.  After hours call [phone] and ask to speak to the GI Fellow on call.

## 2023-01-13 NOTE — Telephone Encounter (Signed)
Contacted [name]'s home/mobile phone.  No answer.  Left generic voicemail for pt to call clinic back.  Mychart Message sent: yes.  If [name] calls back, please forward call to advice nurse for further assessment and evaluation with reported symptom(s):  COVID19.     Thank you.  [name], RN

## 2023-01-13 NOTE — Telephone Encounter (Signed)
From: Rebeca Allegra  To: Kate Sable  Sent: 01/12/2023 7:55 PM PDT  Subject: At home test positive for COVID19    Sunday night, today, I tested positive for COVID 19. Would you recommend antiviral treatment for me? 57 years, asthma.

## 2023-01-14 NOTE — Telephone Encounter (Signed)
Thank you Johnfred. Would you be able to call patient again tomorrow to see if you can touch base with him/ask if he would like to be scheduled for an appointment? I have availability tomorrow afternoon.    - Elchanan Bob

## 2023-01-15 NOTE — Telephone Encounter (Signed)
Contacted Alex Werner's home/mobile phone.  No answer.  Left generic voicemail for pt to call clinic back.  If Alex Werner calls back, please forward call to advice nurse for further assessment and evaluation with reported symptom(s):  COVID19.     Thank you.  Carma Dwiggins, RN

## 2023-01-15 NOTE — Telephone Encounter (Signed)
Thank you Alex Werner, noted. Glad that he read the message and appreciate your help in trying to reach out to him.

## 2023-01-17 ENCOUNTER — Other Ambulatory Visit: Payer: Self-pay

## 2023-06-13 ENCOUNTER — Ambulatory Visit: Payer: Commercial Managed Care - PPO | Attending: Internal Medicine

## 2023-06-13 DIAGNOSIS — Z0000 Encounter for general adult medical examination without abnormal findings: Secondary | ICD-10-CM | POA: Insufficient documentation

## 2023-06-13 LAB — COMPREHENSIVE METABOLIC PANEL
Alanine Transferase (ALT): 16 U/L (ref <=41)
Albumin: 4 g/dL (ref 4.0–4.9)
Alkaline Phosphatase (ALP): 68 U/L (ref 35–129)
Anion Gap: 10 mmol/L (ref 7–15)
Aspartate Transaminase (AST): 21 U/L (ref <=41)
Bilirubin Total: 0.3 mg/dL (ref <=1.2)
Calcium: 8.9 mg/dL (ref 8.6–10.0)
Carbon Dioxide Total: 25 mmol/L (ref 22–29)
Chloride: 108 mmol/L — ABNORMAL HIGH (ref 98–107)
Creatinine Serum: 1.12 mg/dL (ref 0.51–1.17)
E-GFR Creatinine (Male): 77 mL/min/{1.73_m2}
Glucose: 89 mg/dL (ref 74–109)
Potassium: 4.2 mmol/L (ref 3.4–5.1)
Protein: 6.6 g/dL (ref 6.6–8.7)
Sodium: 143 mmol/L (ref 136–145)
Urea Nitrogen, Blood (BUN): 21 mg/dL — ABNORMAL HIGH (ref 6–20)

## 2023-06-13 LAB — CBC WITH DIFFERENTIAL
Basophils % Auto: 0.8 %
Basophils Abs Auto: 0 10*3/uL (ref 0.0–0.2)
Eosinophils % Auto: 4.4 %
Eosinophils Abs Auto: 0.2 10*3/uL (ref 0.0–0.5)
Hematocrit: 42.8 % (ref 41.0–53.0)
Hemoglobin: 14.2 g/dL (ref 13.5–17.5)
Lymphocytes % Auto: 32.6 %
Lymphocytes Abs Auto: 1.6 10*3/uL (ref 1.0–4.8)
MCH: 29.5 pg (ref 27.0–33.0)
MCHC: 33.1 % (ref 32.0–36.0)
MCV: 89 fL (ref 80.0–100.0)
MPV: 9.3 fL (ref 6.8–10.0)
Monocytes % Auto: 9.4 %
Monocytes Abs Auto: 0.5 10*3/uL (ref 0.1–0.8)
Neutrophils % Auto: 52.8 %
Neutrophils Abs Auto: 2.7 10*3/uL (ref 1.8–7.7)
Platelet Count: 179 10*3/uL (ref 130–400)
RDW: 12.9 % (ref 0.0–14.7)
Red Blood Cell Count: 4.81 10*6/uL (ref 4.50–5.90)
White Blood Cell Count: 5 10*3/uL (ref 4.5–11.0)

## 2023-06-13 LAB — LIPID PANEL WITH DLDL REFLEX
Cholesterol: 204 mg/dL — ABNORMAL HIGH (ref <200)
HDL Cholesterol: 52 mg/dL (ref >40)
LDL Cholesterol Calculation: 134 mg/dL — ABNORMAL HIGH (ref <100)
Non-HDL Cholesterol: 152 mg/dL — ABNORMAL HIGH (ref <=150)
Total Cholesterol: HDL Ratio: 3.9 (ref <4.0)
Triglyceride Level: 89 mg/dL (ref <150)

## 2023-06-13 LAB — HEMOGLOBIN A1C
Hgb A1C,Glucose Est Avg: 108 mg/dL
Hgb A1C: 5.4 % (ref 3.9–5.6)

## 2023-06-20 ENCOUNTER — Ambulatory Visit: Payer: Commercial Managed Care - PPO | Attending: Otolaryngology

## 2023-06-20 DIAGNOSIS — Z23 Encounter for immunization: Secondary | ICD-10-CM | POA: Insufficient documentation

## 2023-06-20 NOTE — Nursing Note (Signed)
The Inactivated Influenza Vaccine VIS document for the Influenza Vaccine was given to the patient/caregiver or legal representative to review. All questions were answered or referred to the physician.    The patient has no contraindications to receive the Influenza Vaccine.The Influenza Vaccine was administered per Policy 504.     The patient was observed for 15 minutes for immediate reactions to the vaccine per protocol. No reaction was observed.      Elias Else, LVN

## 2023-07-08 NOTE — Progress Notes (Addendum)
ASSESSMENT & PLAN       Alex Werner is a 58yr old male with a past medical history of asthma presenting to for follow up.     # HTN, not at goal  Patient with elevated bp reading to SBP 150s in multiple different settings (clinic, [facility], dentist) suggestive of hypertension. Not currently on antihypertensives but is on minoxidil for hair growth. Not currently checking his bp at home.  Denies chest pain, SOB, LE edema, new focal neurologic symptoms. Will initiate patient on amlodipine as below. Discussed risk for peripheral edema which patient will monitor for.   - Start amlodipine 5mg   - Start checking bp at home (log with instructions provided) + bring this to follow up    # HLD  The 10-year ASCVD risk score (Arnett DK, et al., 2019) is: 7.7%    Values used to calculate the score:      Age: 89 years      Sex: Male      Is Non-Hispanic African American: No      Diabetic: No      Tobacco smoker: No      Systolic Blood Pressure: 147 mmHg      Is BP treated: No      HDL Cholesterol: 52 mg/dL      Total Cholesterol: 204 mg/dL  - Elevated ASCVD risk as above. Patient requesting repeat lipid panel as most recent testing over holidays. Will reorder labs as below. If ASCVD persistently elevated discussed initiation of statin which patient is amenable to.   - Repeat lipid panel     # Soft Tissue Mass  Patient with soft-tissue lesion on posterior neck. Has had this for 3-4 years, recently increased in size. By palpation, feels similar to a lipoma. Not firm/hard, immobile and no other palpable or noted LAD. Patient also with prior hx of lipomas that have been removed in the past. Not hot or erythematous, do not suspect infected lesion.  - Soft tissue ultrasound ordered for further characterization    # HCM  Medical history and medications updated and reviewed. Vitals acceptable today. HCM up dated and labs ordered if appropriate.  - Refills of long-standing sertraline, albuterol provided today   - COVID vaccine ordered today  -  R/B discussion regarding PSA screening discussed today. Patient opting to initiate screening. All questions answered and addressed  - Optometry referral fro eye glasses exam requested    Current Outpatient Medications   Medication Instructions    Albuterol (PROAIR HFA, PROVENTIL HFA, VENTOLIN HFA) 90 mcg/actuation inhaler 2 puffs, INHALATION, EVERY 4 HOURS IF NEEDED    Amlodipine (NORVASC) 5 mg, ORAL, DAILY (OUTPATIENT), (blood pressure)    Budesonide (PULMICORT FLEXHALER) 90 mcg/actuation Inhaler 1 puff, INHALATION, TWO TIMES DAILY    Doxycycline (VIBRAMYCIN) 50 mg, DAILY MORNING    Fluticasone (FLOVENT HFA) 110 mcg, INHALATION, EVERY 12 HOURS    Fluticasone/Salmeterol (WIXELA INHUB) 250-50 mcg/dose Inhaler 1 puff, TWO TIMES DAILY    Minoxidil (LONITEN) 5 mg, DAILY (OUTPATIENT)    PEG-Electrolyte Soln (COLYTE, GOLYTELY) 236-22.74-6.74 -5.86 gram Liquid Take as directed by [facility]. Do not follow instructions on the box. Disp-4000 mL, R-0, Pharmacy okay to dispense Gavilyte, C, G, or N. Also OK to dispense Nulytely, generic Moviprep, Colyte, or Golytely.    Sertraline (ZOLOFT) 25 mg, ORAL, DAILY MORNING      Review and Address: Health Maintenance Medication Management        Return if symptoms worsen or fail to  improve.    SUBJECTIVE      Alex Werner is a 58yr old male with a past medical history of asthma presenting to for follow up.     Interval History:  - Has not checked blood pressure at home  - Went to dentist and it was elevated 150s  - Bp taken at [facility] was also elevated 150s  - Diet: avoids salty foods   - Meals: salads, meats, vegetables  - Exercise: Walks a lot; high intensity training for long-distance backpacking   - 80,000 steps - 100, 000 steps per week  - Alcohol: occasional cocktail or wine with dinner (1-3 drinks/week)   - No chest pain, shortness, new n/t/weakness  - BP cuff at home; would be open to checking at home  - Currently taking oral minoxidil for hair growth  - Has a soft tissue  lesion on the left side of his neck posteriorly  - Present over the last 3-4 years  - Feels it has been increasing in size over the past few months  - No recent illnesses  - Not tender or painful  - Has not noted LAD elsewhere    OBJECTIVE      His height is 1.753 m (5\' 9" ) and weight is 108 kg (238 lb 1.6 oz). His temporal temperature is 35.9 C (96.6 F) (abnormal). His blood pressure is 147/93 (abnormal) and his pulse is 53. His respiration is 16 and oxygen saturation is 98%.     Physical Exam:   General: Pleasant, well appearing male, NAD  HEENT: Soft tissue 1-2 cm lesion of posterior neck. Not firm or rock-hard. Mobile. Otherwise no palpable LAD  Heart: Regular rate, no murmurs  Lungs: CTAB, no wheezes or crackles  Abd: Soft, NT, ND  Extremities; Warm, well perfused. No pitting edema  Neuro: Moving all extremities spontaneously  Orientation: Alert and oriented to conversation.    Relevant Results    Latest Reference Range & Units 06/13/23 07:59   Sodium 136 - 145 mmol/L 143   Potassium 3.4 - 5.1 mmol/L 4.2   Chloride 98 - 107 mmol/L 108 (H)   Carbon Dioxide Total 22 - 29 mmol/L 25   Anion Gap 7 - 15 mmol/L 10   Urea Nitrogen, Blood (BUN) 6 - 20 mg/dL 21 (H)   Creatinine Blood 0.51 - 1.17 mg/dL 2.95   Glucose 74 - 621 mg/dL 89   Calcium 8.6 - 30.8 mg/dL 8.9   Protein 6.6 - 8.7 g/dL 6.6   Albumin 4.0 - 4.9 g/dL 4.0   Alkaline Phosphatase (ALP) 35 - 129 U/L 68   Aspartate Transaminase (AST) <=41 U/L 21   Bilirubin Total <=1.2 mg/dL 0.3   Alanine Transferase (ALT) <=41 U/L 16   E-GFR Creatinine (Male) >60 See Note mL/min/1.58m*2 77   Cholesterol <200 mg/dL 657 (H)   Triglyceride <150 mg/dL 89   LDL Cholesterol Calculation <100 mg/dL 846 (H)   HDL Cholesterol >40 mg/dL 52   Non-HDL Cholesterol <=150 mg/dL 962 (H)   Total Cholesterol:HDL Ratio <4.0  3.9   (H): Data is abnormally high     Latest Reference Range & Units 06/13/23 07:59   Hgb A1C 3.9 - 5.6 % 5.4   Hgb A1C,Glucose Est Avg mg/dL 952   White Blood Cell  Count 4.5 - 11.0 K/MM3 5.0   Red Blood Cell Count 4.50 - 5.90 M/MM3 4.81   Hemoglobin 13.5 - 17.5 g/dL 84.1   Hematocrit 32.4 - 53.0 % 42.8   MCV  80.0 - 100.0 fL 89.0   MCH 27.0 - 33.0 pg 29.5   MCHC 32.0 - 36.0 % 33.1   RDW 0.0 - 14.7 % 12.9   Platelet Count 130 - 400 K/MM3 179   MPV 6.8 - 10.0 fL 9.3       Today's visit has complexity inherent to evaluation and management associated with medical care services that serve as the continuing focal point for all needed health care services.    Total time I spent in care of this patient today (excluding time spent on other billable services) was 50 minutes.    Patient seen and discussed with Dr Levy Sjogren, MD  Internal Medicine  PGY-2      Answers submitted by the patient for this visit:  HYPERLIPIDEMIA (Submitted on 07/07/2023)  Chief Complaint: Hyperlipidemia  Chronicity: new  Onset: new diagnosis  Condition status: uncontrolled  Lipid results: high  obesity: Yes  Aggravated by: no known factors  Current therapy: no antihyperlipidemic treatment  Compliance problems: adherence to diet, adherence to exercise  CAD risks: family history, male sex, obesity  Patient Health Questionnaire (Submitted on 07/07/2023)  PHQ-2 Score:: 0  Patient Health Questionnaire (Submitted on 07/07/2023)  PHQ-9 Score: : 2    Internal Medicine Attending Addendum:    Rebeca Allegra was seen, evaluated, and the plan of care was developed with the resident.  I agree with the assessment and plan as outlined in the resident's note.      Total time I spent in care of this patient today (excluding time spent on other billable services) was 20 minutes.    Selena Batten. Starr Lake, MD  Internal Medicine

## 2023-07-09 ENCOUNTER — Ambulatory Visit: Payer: Commercial Managed Care - PPO | Attending: Internal Medicine

## 2023-07-09 VITALS — BP 147/93 | HR 53 | Temp 96.6°F | Resp 16 | Ht 69.0 in | Wt 238.1 lb

## 2023-07-09 DIAGNOSIS — I10 Essential (primary) hypertension: Secondary | ICD-10-CM | POA: Insufficient documentation

## 2023-07-09 DIAGNOSIS — F419 Anxiety disorder, unspecified: Secondary | ICD-10-CM | POA: Insufficient documentation

## 2023-07-09 DIAGNOSIS — H539 Unspecified visual disturbance: Secondary | ICD-10-CM | POA: Insufficient documentation

## 2023-07-09 DIAGNOSIS — Z23 Encounter for immunization: Secondary | ICD-10-CM | POA: Insufficient documentation

## 2023-07-09 DIAGNOSIS — J45909 Unspecified asthma, uncomplicated: Secondary | ICD-10-CM | POA: Insufficient documentation

## 2023-07-09 DIAGNOSIS — Z125 Encounter for screening for malignant neoplasm of prostate: Secondary | ICD-10-CM | POA: Insufficient documentation

## 2023-07-09 DIAGNOSIS — R221 Localized swelling, mass and lump, neck: Secondary | ICD-10-CM | POA: Insufficient documentation

## 2023-07-09 DIAGNOSIS — E785 Hyperlipidemia, unspecified: Secondary | ICD-10-CM | POA: Insufficient documentation

## 2023-07-09 MED ORDER — ALBUTEROL SULFATE HFA 90 MCG/ACTUATION AEROSOL INHALER: 2.0000 | INHALATION_SPRAY | RESPIRATORY_TRACT | 2 refills | Status: DC | PRN

## 2023-07-09 MED ORDER — AMLODIPINE 5 MG TABLET: 5.0000 mg | ORAL_TABLET | Freq: Every day | ORAL | 1 refills | Status: AC

## 2023-07-09 MED ORDER — SERTRALINE 25 MG TABLET: 25.0000 mg | ORAL_TABLET | Freq: Every day | ORAL | 0 refills | Status: AC

## 2023-07-09 NOTE — Patient Instructions (Addendum)
After-visit Patient Instructions:    [facility] Internal [facility]  [address]    [place], [place] [address]  Phone [phone]    Fax [phone]    Plan from today's visit:  I have ordered an ultrasound of your neck  I have ordered repeat lipid panel. I will reach out to you to discuss the results of this  Please start checking your blood pressure at home and bring a log to your follow up  Please start taking amlodipine 5mg  qdaily (monitor for swelling of your legs)   I have ordered a PSA screen after discussing the risks benefits of screneing    Please follow up with:  Kate Sable, MD Return if symptoms worsen or fail to improve.    REFERRALS If a referral for specialist care was placed today, expect a call or MyChart notification on how to schedule this appointment within 7 business days.  If you are not contacted in that time frame, please contact our clinic.   RADIOLOGY X-rays: Available on a drop in basis.  Ultrasound / Mammogram / DEXA (xray bone density test): Please call [facility]: [phone] to make an appointment.  CT, MRI, other complex imaging studies: Insurance authorization and scheduling is required. If you are not contacted in 7 business days, please contact [facility] (number above).  [facility] website: [email or web address]   MEDICINES Please take all medications as prescribed and let us know if you need refills. Please keep track of ALL medications, herbs, supplements, or over the counter medications that you take. Review these with your doctor at each visit.   PRECAUTIONS Please call our office, return to clinic, or seek emergency department care if your symptoms worsen.   MYCHART If you have not done so already, please sign up for MyChart.  MyChart is a secure online portal to [facility] Smart phone apps are available. [email or web address]    GENERAL HEALTH INFORMATION Tobacco: If you smoke/use tobacco, please stop for your  health.  Alcohol: Please avoid drinking more than 1 alcoholic beverage(s) per day. (1 alcoholic beverage = 1.5oz liquor, 4oz wine, 12oz beer). If you have liver disease, heart failure, diabetes, cholesterol problems, dementia, or are on medicines that may interact with alcohol, please do not drink.  Exercise: Please engage in exercise as directed. Aim for 5 days of 30 minutes of light-moderate aerobic exercise per week. If you can, add 1-2 days of resistance activities (weights or resistance bands) per week.   Diet: Please eat a healthy diet (high in fruits, vegetables, and fiber, low in saturated or trans fats, low in simple carbohydrates) in addition any other specific dietary restrictions related to your illness(es).    Please discuss which vaccinations are recommended for your age and risk factors with your doctor.

## 2023-07-09 NOTE — Nursing Note (Signed)
Identifiers x 2, Vital signs taken, allergies verified, screened for pain, tobacco history reviewed, preferred pharmacy verified and refills needed no.   Patient had a loop mask on upon rooming and was instructed to continue to wear mask for the rest of the visit. I had a loop mask and gloves during the rooming process of patient care.

## 2023-07-09 NOTE — Nursing Note (Signed)
COVID-19 Vaccine Documentation:  The patient is eligible to receive the COVID-19 Vaccine at this time:Yes    COVID-19 Vaccine VIS given to the patient/caregiver for review. All patient/caregiver questions were answered and the patient/caregiver consents to the COVID-19 vaccine being given today.      Vaccine prepared and administered according to the Prescribing Information.       COVID-19 Vaccine Observation:  The patient was observed for 15 minutes for immediate reactions to the vaccine per protocol. No reaction was observed.

## 2023-11-20 ENCOUNTER — Other Ambulatory Visit: Payer: Self-pay | Admitting: Urology

## 2023-11-20 DIAGNOSIS — R972 Elevated prostate specific antigen [PSA]: Secondary | ICD-10-CM

## 2023-12-01 ENCOUNTER — Encounter: Payer: Self-pay | Admitting: Urology

## 2023-12-27 ENCOUNTER — Ambulatory Visit
Admission: RE | Admit: 2023-12-27 | Discharge: 2023-12-27 | Disposition: A | Discharge status: Home / Self Care | Payer: Self-pay | Source: Ambulatory Visit | Attending: Urology | Admitting: Urology

## 2023-12-27 DIAGNOSIS — R972 Elevated prostate specific antigen [PSA]: Secondary | ICD-10-CM

## 2023-12-27 MED ORDER — GADOPICLENOL 0.5 MMOL/ML IV SOLN
10.0000 mL | Freq: Once | INTRAVENOUS | Status: AC | PRN
  Administered 2023-12-27: 10 mL via INTRAVENOUS

## 2024-04-24 ENCOUNTER — Other Ambulatory Visit: Payer: Self-pay

## 2024-04-24 DIAGNOSIS — J45909 Unspecified asthma, uncomplicated: Secondary | ICD-10-CM

## 2024-04-26 NOTE — Telephone Encounter (Signed)
 Pharmacy Refill Optimization (PRO)    Refill authorized per PRO CPA 690-00 04/26/2024    Meets PRO CPA 690-00: YES   - Last reviewed 07/09/2023    See Protocol Details for additional information   ====================================================================    Medication name:   Requested Prescriptions     Pending Prescriptions Disp Refills    albuterol  (Proair  HFA, Proventil  HFA, Ventolin  HFA) 90 mcg/actuation inhaler [Pharmacy Med Name: ALBUTEROL  HFA (PROVENTIL ) INH] 6.7 each 2     Sig: Take 2 puffs by inhalation every 4 hours if needed.     Labs (if required by protocol): N/A

## 2024-05-05 ENCOUNTER — Ambulatory Visit

## 2024-05-05 ENCOUNTER — Ambulatory Visit: Attending: Internal Medicine

## 2024-05-05 ENCOUNTER — Telehealth: Payer: Self-pay

## 2024-05-05 DIAGNOSIS — E785 Hyperlipidemia, unspecified: Secondary | ICD-10-CM | POA: Insufficient documentation

## 2024-05-05 DIAGNOSIS — Z125 Encounter for screening for malignant neoplasm of prostate: Secondary | ICD-10-CM | POA: Insufficient documentation

## 2024-05-05 DIAGNOSIS — R221 Localized swelling, mass and lump, neck: Secondary | ICD-10-CM

## 2024-05-05 LAB — LIPID PANEL WITH DLDL REFLEX
Cholesterol: 162 mg/dL (ref <200)
HDL Cholesterol: 43 mg/dL (ref >40)
LDL Cholesterol Calculation: 106 mg/dL — ABNORMAL HIGH (ref <100)
Non-HDL Cholesterol: 119 mg/dL (ref <=150)
Total Cholesterol: HDL Ratio: 3.8 (ref <4.0)
Triglyceride Level: 63 mg/dL (ref <150)

## 2024-05-05 LAB — PSA SCREEN: PSA Screen: 1.3 ng/mL (ref 0.0–3.5)

## 2024-05-05 NOTE — Telephone Encounter (Signed)
 Referral Request:    Patient is calling to request new referral to Radiology regarding a problem of US  SOFT TISSUE EXTREMITY (NONVASCULAR) . He requests a referral his insurance is [employer].     Patient's last referral expired 12/2023. Patient is requesting a new order.     Bernice LULLA LORETTE PONCE  [facility]    Phone: [phone]  Fax: [phone]

## 2024-05-10 ENCOUNTER — Ambulatory Visit: Payer: Self-pay

## 2024-06-04 ENCOUNTER — Ambulatory Visit
Admission: RE | Admit: 2024-06-04 | Discharge: 2024-06-04 | Disposition: A | Discharge status: Home / Self Care | Source: Ambulatory Visit

## 2024-06-04 DIAGNOSIS — R221 Localized swelling, mass and lump, neck: Secondary | ICD-10-CM

## 2024-06-12 ENCOUNTER — Ambulatory Visit: Payer: Self-pay

## 2024-08-11 ENCOUNTER — Ambulatory Visit
